# Patient Record
Sex: Female | Born: 1980 | Race: White | Hispanic: No | Smoking: Never smoker
Health system: Southern US, Community
[De-identification: ages and names within clinical notes are randomized; demographics above are authoritative.]

## PROBLEM LIST (undated history)

## (undated) DIAGNOSIS — J189 Pneumonia, unspecified organism: Secondary | ICD-10-CM

## (undated) DIAGNOSIS — M25372 Other instability, left ankle: Secondary | ICD-10-CM

## (undated) DIAGNOSIS — I10 Essential (primary) hypertension: Secondary | ICD-10-CM

## (undated) DIAGNOSIS — G43909 Migraine, unspecified, not intractable, without status migrainosus: Secondary | ICD-10-CM

## (undated) DIAGNOSIS — E669 Obesity, unspecified: Secondary | ICD-10-CM

## (undated) DIAGNOSIS — N3010 Interstitial cystitis (chronic) without hematuria: Secondary | ICD-10-CM

## (undated) DIAGNOSIS — M1990 Unspecified osteoarthritis, unspecified site: Secondary | ICD-10-CM

## (undated) DIAGNOSIS — F419 Anxiety disorder, unspecified: Secondary | ICD-10-CM

## (undated) DIAGNOSIS — K219 Gastro-esophageal reflux disease without esophagitis: Secondary | ICD-10-CM

## (undated) HISTORY — PX: LAPAROSCOPIC LYSIS OF ADHESIONS: SHX5905

## (undated) HISTORY — PX: GASTRIC BYPASS: SHX52

## (undated) HISTORY — PX: DILATION AND CURETTAGE OF UTERUS: SHX78

---
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
## 2000-03-24 ENCOUNTER — Other Ambulatory Visit: Admission: RE | Admit: 2000-03-24 | Discharge: 2000-03-24 | Payer: Self-pay | Admitting: *Deleted

## 2000-09-29 ENCOUNTER — Other Ambulatory Visit: Admission: RE | Admit: 2000-09-29 | Discharge: 2000-09-29 | Payer: Self-pay | Admitting: *Deleted

## 2001-02-03 ENCOUNTER — Encounter: Payer: Self-pay | Admitting: Family Medicine

## 2001-02-03 ENCOUNTER — Encounter: Admission: RE | Admit: 2001-02-03 | Discharge: 2001-02-03 | Payer: Self-pay | Admitting: Family Medicine

## 2001-05-26 ENCOUNTER — Other Ambulatory Visit: Admission: RE | Admit: 2001-05-26 | Discharge: 2001-05-26 | Payer: Self-pay | Admitting: Obstetrics and Gynecology

## 2001-07-05 ENCOUNTER — Encounter: Payer: Self-pay | Admitting: *Deleted

## 2001-07-05 ENCOUNTER — Ambulatory Visit (HOSPITAL_COMMUNITY): Admission: RE | Admit: 2001-07-05 | Discharge: 2001-07-05 | Payer: Self-pay | Admitting: Obstetrics and Gynecology

## 2001-11-15 ENCOUNTER — Inpatient Hospital Stay (HOSPITAL_COMMUNITY): Admission: AD | Admit: 2001-11-15 | Discharge: 2001-11-15 | Payer: Self-pay | Admitting: *Deleted

## 2001-12-11 ENCOUNTER — Inpatient Hospital Stay (HOSPITAL_COMMUNITY): Admission: AD | Admit: 2001-12-11 | Discharge: 2001-12-14 | Payer: Self-pay | Admitting: *Deleted

## 2002-01-23 ENCOUNTER — Other Ambulatory Visit: Admission: RE | Admit: 2002-01-23 | Discharge: 2002-01-23 | Payer: Self-pay | Admitting: *Deleted

## 2002-05-08 ENCOUNTER — Encounter: Payer: Self-pay | Admitting: Family Medicine

## 2002-05-08 ENCOUNTER — Encounter: Admission: RE | Admit: 2002-05-08 | Discharge: 2002-05-08 | Payer: Self-pay | Admitting: Family Medicine

## 2003-02-07 ENCOUNTER — Other Ambulatory Visit: Admission: RE | Admit: 2003-02-07 | Discharge: 2003-02-07 | Payer: Self-pay | Admitting: *Deleted

## 2003-11-20 ENCOUNTER — Inpatient Hospital Stay (HOSPITAL_COMMUNITY): Admission: AD | Admit: 2003-11-20 | Discharge: 2003-11-21 | Payer: Self-pay | Admitting: Obstetrics and Gynecology

## 2004-04-09 ENCOUNTER — Other Ambulatory Visit: Admission: RE | Admit: 2004-04-09 | Discharge: 2004-04-09 | Payer: Self-pay | Admitting: Obstetrics and Gynecology

## 2004-10-02 ENCOUNTER — Inpatient Hospital Stay (HOSPITAL_COMMUNITY): Admission: AD | Admit: 2004-10-02 | Discharge: 2004-10-02 | Payer: Self-pay | Admitting: Obstetrics and Gynecology

## 2004-10-24 ENCOUNTER — Inpatient Hospital Stay (HOSPITAL_COMMUNITY): Admission: RE | Admit: 2004-10-24 | Discharge: 2004-10-26 | Payer: Self-pay | Admitting: Obstetrics and Gynecology

## 2004-12-10 ENCOUNTER — Other Ambulatory Visit: Admission: RE | Admit: 2004-12-10 | Discharge: 2004-12-10 | Payer: Self-pay | Admitting: Obstetrics and Gynecology

## 2005-10-01 ENCOUNTER — Encounter (INDEPENDENT_AMBULATORY_CARE_PROVIDER_SITE_OTHER): Payer: Self-pay | Admitting: *Deleted

## 2005-10-01 ENCOUNTER — Ambulatory Visit (HOSPITAL_COMMUNITY): Admission: RE | Admit: 2005-10-01 | Discharge: 2005-10-01 | Payer: Self-pay | Admitting: Obstetrics and Gynecology

## 2005-10-01 HISTORY — PX: HYSTEROSCOPY W/ ENDOMETRIAL ABLATION: SUR665

## 2006-06-09 ENCOUNTER — Ambulatory Visit (HOSPITAL_COMMUNITY): Admission: RE | Admit: 2006-06-09 | Discharge: 2006-06-09 | Payer: Self-pay | Admitting: Obstetrics and Gynecology

## 2006-09-08 ENCOUNTER — Inpatient Hospital Stay (HOSPITAL_COMMUNITY): Admission: AD | Admit: 2006-09-08 | Discharge: 2006-09-08 | Payer: Self-pay | Admitting: Obstetrics and Gynecology

## 2006-10-01 ENCOUNTER — Inpatient Hospital Stay (HOSPITAL_COMMUNITY): Admission: AD | Admit: 2006-10-01 | Discharge: 2006-10-05 | Payer: Self-pay | Admitting: Obstetrics and Gynecology

## 2006-10-01 ENCOUNTER — Encounter (INDEPENDENT_AMBULATORY_CARE_PROVIDER_SITE_OTHER): Payer: Self-pay | Admitting: Obstetrics and Gynecology

## 2006-10-01 HISTORY — PX: TUBAL LIGATION: SHX77

## 2007-02-11 ENCOUNTER — Encounter (INDEPENDENT_AMBULATORY_CARE_PROVIDER_SITE_OTHER): Payer: Self-pay | Admitting: Obstetrics and Gynecology

## 2007-02-11 ENCOUNTER — Ambulatory Visit (HOSPITAL_COMMUNITY): Admission: RE | Admit: 2007-02-11 | Discharge: 2007-02-11 | Payer: Self-pay | Admitting: Obstetrics and Gynecology

## 2007-02-11 HISTORY — PX: HYSTEROSCOPY WITH NOVASURE: SHX5574

## 2007-09-04 ENCOUNTER — Inpatient Hospital Stay (HOSPITAL_COMMUNITY): Admission: AD | Admit: 2007-09-04 | Discharge: 2007-09-04 | Payer: Self-pay | Admitting: Obstetrics and Gynecology

## 2007-10-17 ENCOUNTER — Encounter: Admission: RE | Admit: 2007-10-17 | Discharge: 2007-10-17 | Payer: Self-pay | Admitting: Family Medicine

## 2007-11-22 ENCOUNTER — Ambulatory Visit (HOSPITAL_COMMUNITY): Admission: RE | Admit: 2007-11-22 | Discharge: 2007-11-23 | Payer: Self-pay | Admitting: Obstetrics and Gynecology

## 2007-11-22 ENCOUNTER — Encounter (INDEPENDENT_AMBULATORY_CARE_PROVIDER_SITE_OTHER): Payer: Self-pay | Admitting: Obstetrics and Gynecology

## 2007-11-22 HISTORY — PX: LAPAROSCOPIC ASSISTED VAGINAL HYSTERECTOMY: SHX5398

## 2008-01-01 ENCOUNTER — Inpatient Hospital Stay (HOSPITAL_COMMUNITY): Admission: AD | Admit: 2008-01-01 | Discharge: 2008-01-01 | Payer: Self-pay | Admitting: Obstetrics and Gynecology

## 2008-01-05 ENCOUNTER — Ambulatory Visit (HOSPITAL_COMMUNITY): Admission: RE | Admit: 2008-01-05 | Discharge: 2008-01-05 | Payer: Self-pay | Admitting: Obstetrics and Gynecology

## 2008-01-10 ENCOUNTER — Ambulatory Visit: Payer: Self-pay | Admitting: Emergency Medicine

## 2008-01-10 ENCOUNTER — Inpatient Hospital Stay (HOSPITAL_COMMUNITY): Admission: AD | Admit: 2008-01-10 | Discharge: 2008-01-15 | Payer: Self-pay | Admitting: Obstetrics and Gynecology

## 2008-01-12 ENCOUNTER — Encounter: Payer: Self-pay | Admitting: Obstetrics and Gynecology

## 2008-01-18 ENCOUNTER — Ambulatory Visit (HOSPITAL_COMMUNITY): Admission: RE | Admit: 2008-01-18 | Discharge: 2008-01-18 | Payer: Self-pay | Admitting: Obstetrics and Gynecology

## 2008-02-07 ENCOUNTER — Ambulatory Visit (HOSPITAL_COMMUNITY): Admission: RE | Admit: 2008-02-07 | Discharge: 2008-02-07 | Payer: Self-pay | Admitting: Obstetrics and Gynecology

## 2008-07-31 ENCOUNTER — Encounter (INDEPENDENT_AMBULATORY_CARE_PROVIDER_SITE_OTHER): Payer: Self-pay | Admitting: Obstetrics and Gynecology

## 2008-07-31 ENCOUNTER — Ambulatory Visit (HOSPITAL_COMMUNITY): Admission: RE | Admit: 2008-07-31 | Discharge: 2008-07-31 | Payer: Self-pay | Admitting: Obstetrics and Gynecology

## 2008-07-31 HISTORY — PX: LAPAROSCOPIC UNILATERAL SALPINGO OOPHERECTOMY: SHX5935

## 2008-07-31 HISTORY — PX: CYSTOSCOPY: SHX5120

## 2008-10-04 ENCOUNTER — Encounter (INDEPENDENT_AMBULATORY_CARE_PROVIDER_SITE_OTHER): Payer: Self-pay | Admitting: Urology

## 2008-10-04 ENCOUNTER — Ambulatory Visit (HOSPITAL_COMMUNITY): Admission: RE | Admit: 2008-10-04 | Discharge: 2008-10-04 | Payer: Self-pay | Admitting: Urology

## 2008-10-04 HISTORY — PX: CYSTOSCOPY W/ DILATION OF BLADDER: SUR374

## 2009-04-02 IMAGING — CT CT ABDOMEN W/ CM
1 of 6 series · 13 of 32 positions shown, 19 images · IV contrast ([ID] GASTRO-MX & 150ml omni/300%)
Comparison: January 18, 2008

CLINICAL DATA: Follow-up right adnexal abscess

CT ABDOMEN WITH CONTRAST,CT PELVIS WITH CONTRAST
TECHNIQUE: Multidetector CT imaging of the abdomen was performed
following the standard protocol during bolus administration of
intravenous contrast.,Technique:  Multidetector CT imaging of the
pelvis was performed following the standard protocol during
Contrast: 150 ml Omnipaque 300

[Series 4: recon 3: abd pelvis · axial · 0.70mm/px · z∈[-485,-86]mm · 13 of 461 slices shown, 19 images]
[im 31/461  soft-tissue]
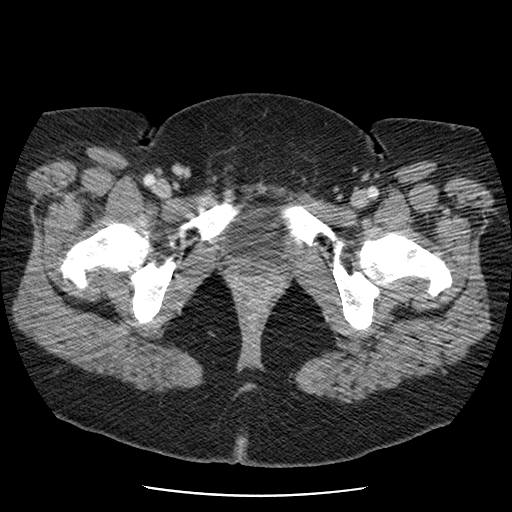
[im 31/461  bone]
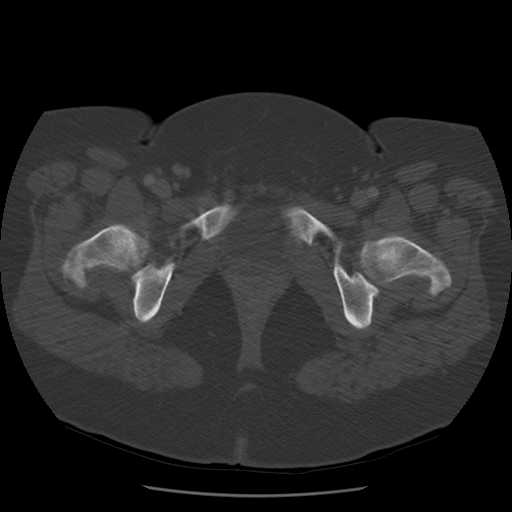
[im 62/461  soft-tissue]
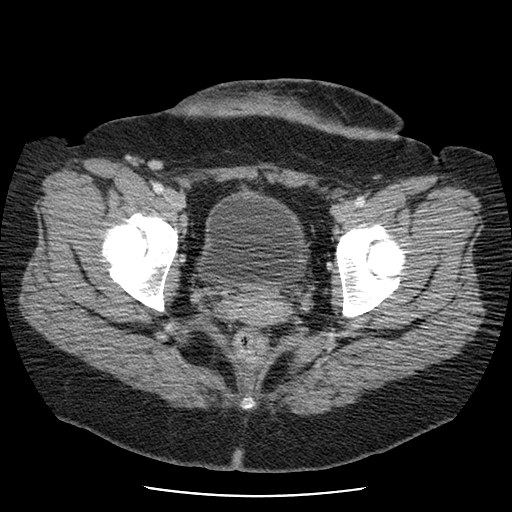
[im 93/461  soft-tissue]
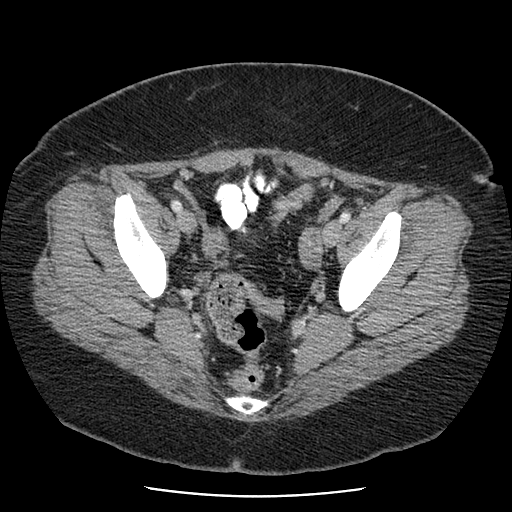
[im 123/461  soft-tissue]
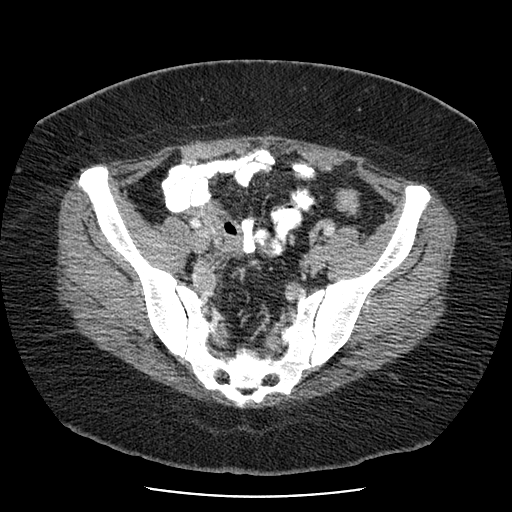
[im 154/461  soft-tissue]
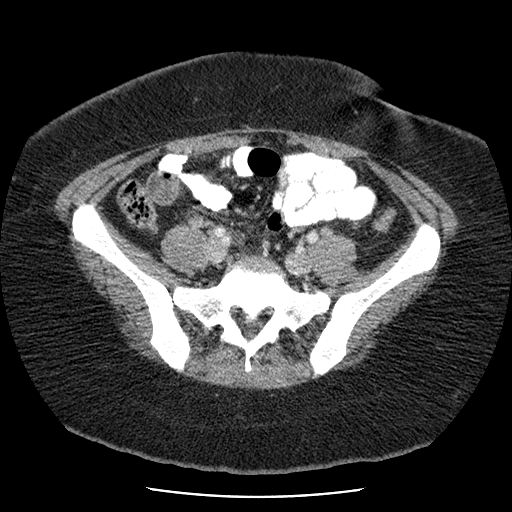
[im 185/461  soft-tissue]
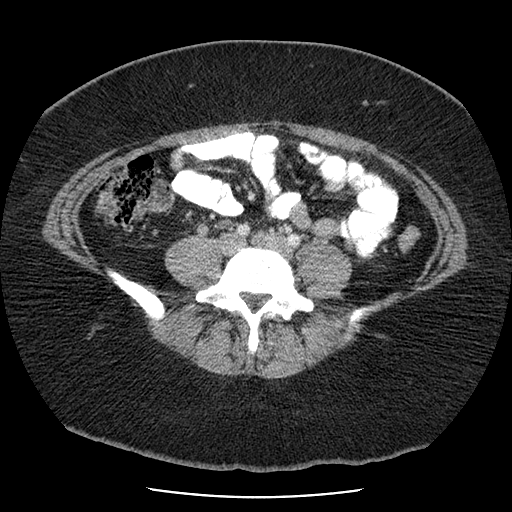
[im 246/461  soft-tissue]
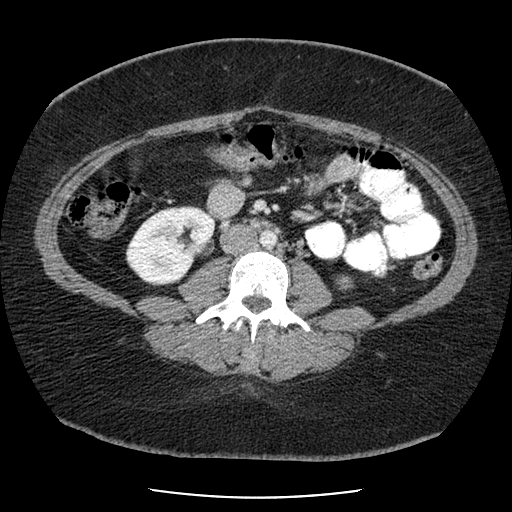
[im 277/461  soft-tissue]
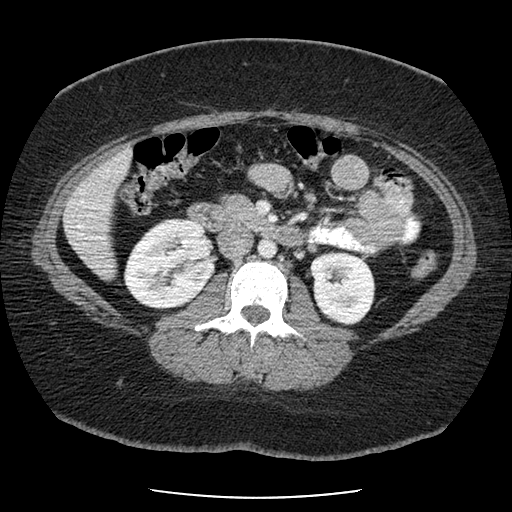
[im 307/461  soft-tissue]
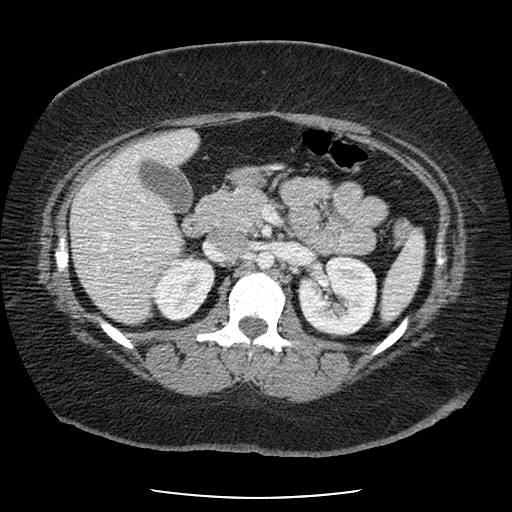
[im 307/461  bone]
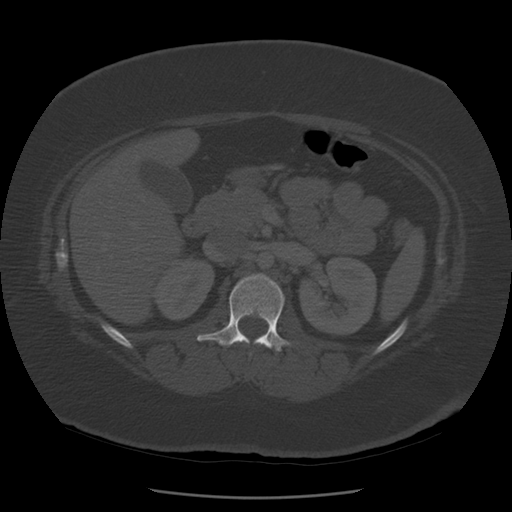
[im 338/461  soft-tissue]
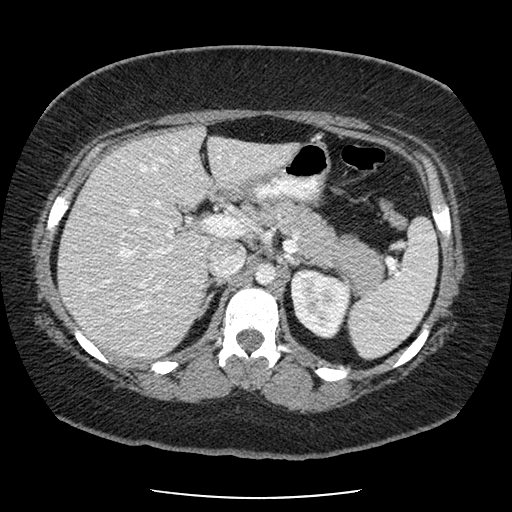
[im 338/461  lung]
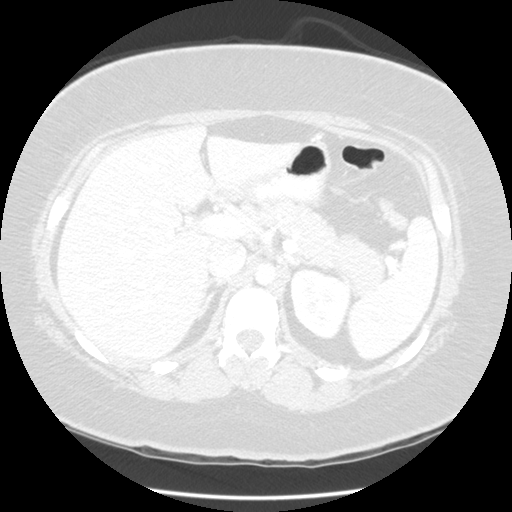
[im 369/461  soft-tissue]
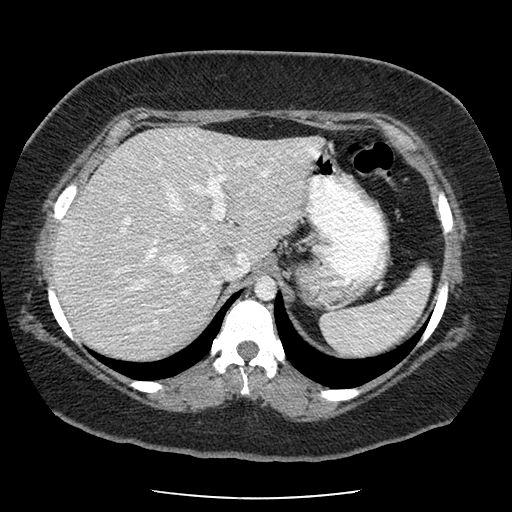
[im 369/461  lung]
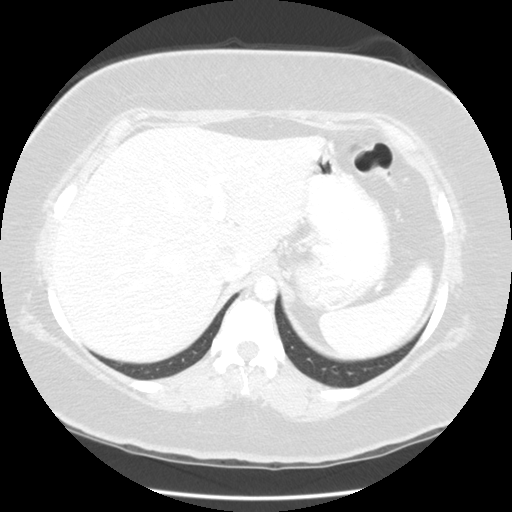
[im 399/461  soft-tissue]
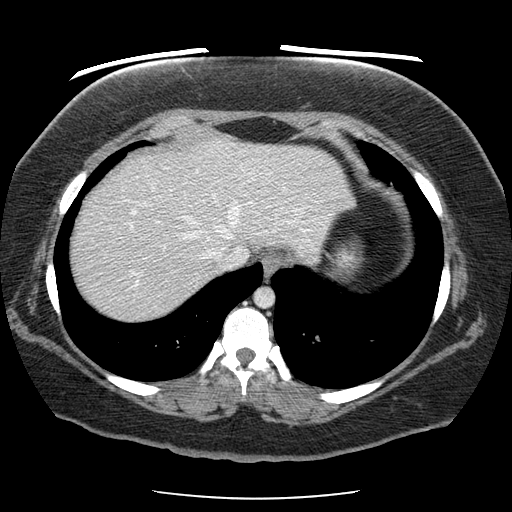
[im 399/461  lung]
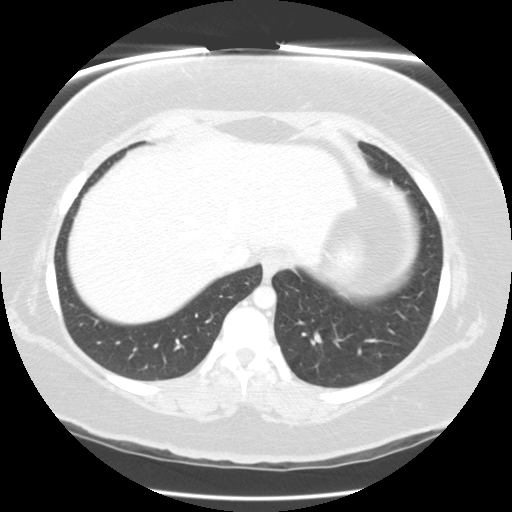
[im 430/461  soft-tissue]
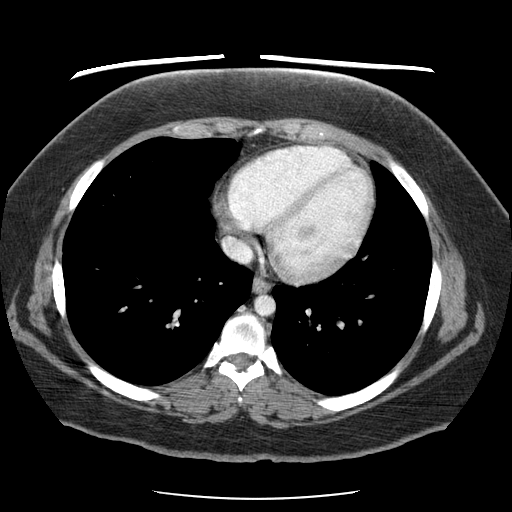
[im 430/461  lung]
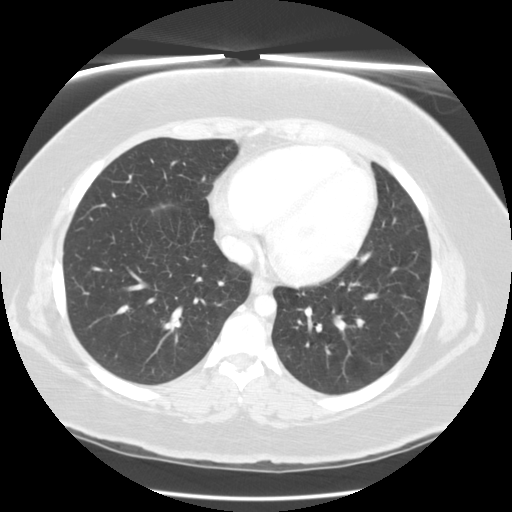

[13 of 32 positions shown; findings below may reference images not displayed]

FINDINGS: Abdomen: The lung bases are clear.  The liver, spleen,
pancreas, adrenal glands, and both kidneys have a normal
appearance.  The proximal right ureter remains mildly dilated, but
unchanged.  No free pelvic fluid or adenopathy are seen. The bowel
has a normal appearance with no evidence of gross inflammation or
obstruction.

Pelvis:  The right adnexal abscess is no longer present.  There are
no new pelvic fluid collections or free pelvic fluid.  No pelvic
adenopathy is present..
IMPRESSION: Interval resolution of right adnexal abscess.

## 2009-05-27 ENCOUNTER — Encounter: Admission: RE | Admit: 2009-05-27 | Discharge: 2009-05-27 | Payer: Self-pay | Admitting: Family Medicine

## 2009-07-26 ENCOUNTER — Encounter: Admission: RE | Admit: 2009-07-26 | Discharge: 2009-07-26 | Payer: Self-pay | Admitting: Family Medicine

## 2009-08-15 ENCOUNTER — Encounter: Admission: RE | Admit: 2009-08-15 | Discharge: 2009-08-30 | Payer: Self-pay | Admitting: Family Medicine

## 2010-07-16 LAB — COMPREHENSIVE METABOLIC PANEL
AST: 22 U/L (ref 0–37)
Alkaline Phosphatase: 97 U/L (ref 39–117)
Chloride: 103 mEq/L (ref 96–112)
GFR calc Af Amer: >60 mL/min (ref >60)
GFR calc non Af Amer: >60 mL/min (ref >60)
Glucose, Bld: 83 mg/dL (ref 70–99)
Potassium: 4.2 mEq/L (ref 3.5–5.1)
Total Protein: 7.1 g/dL (ref 6.0–8.3)

## 2010-07-16 LAB — CBC
HCT: 40.3 % (ref 36.0–46.0)
MCHC: 33.1 g/dL (ref 30.0–36.0)
Platelets: 271 10*3/uL (ref 150–400)
RBC: 4.92 MIL/uL (ref 3.87–5.11)
RDW: 16.5 % — ABNORMAL HIGH (ref 11.5–15.5)

## 2010-08-19 NOTE — Op Note (Signed)
NAME:  Barbara [name], Barbara [name]             ACCOUNT NO.:  [id number]   MEDICAL RECORD NO.:  [id number]          PATIENT TYPE:  AMB   LOCATION:  DAY                          FACILITY:  [facility]   PHYSICIAN:  Sigmund I. Patsi Sears, M.D.DATE OF BIRTH:  01-Sep-1980   DATE OF PROCEDURE:  10/04/2008  DATE OF DISCHARGE:                               OPERATIVE REPORT   PREOPERATIVE DIAGNOSIS:  Interstitial cystitis.   POSTOPERATIVE DIAGNOSIS:  Interstitial cystitis.   OPERATIONS:  Cystourethroscopy, hydrodistention of bladder from 700-750  mL, cold cup bladder biopsy, installation of Pyridium Marcaine in the  bladder, injection of Marcaine Kenalog in the subtrigonal space, B and O  suppository, IV Toradol.   SURGEON:  Sigmund I. Patsi Sears, M.D.   ANESTHESIA:  General LMA.   PREPARATION:  After appropriate preanesthesia, the patient is brought to  the operating room, placed on the operating room table in dorsal supine  position where general LMA anesthesia was induced.  She was then  replaced in dorsal lithotomy position where the pubis was prepped with  Betadine solution and draped in usual fashion.   HISTORY:  This 30 year old para 4 female, has a history of  endometriosis, polycystic ovarian syndrome, chronic suprapubic pain,  dyspareunia, and migraines.  Her puff score is 19.  She also has  nocturia x 4 to 6.  She is now for cystoscopic evaluation for possible  IC.   PROCEDURE:  Cystourethroscopy accomplished, and shows a normal-appearing  urethra.  The bladder trigone is normal position, ureteral orifices were  in normal position on the trigone.  The bladder holds 700 mL.  Even  without hydrodistention, the bladder shows microscopic hemorrhage and  ulceration.   The bladder holds 700 mL, and with hydrodistention, holds 750 mL.  It  appears to be stiff.  Cold cup bladder biopsies were taken, and biopsy  sites cauterized.  Following this, Pyridium Marcaine solution is  inserted in the  bladder, and Marcaine Kenalog solution is injected into  the subtrigonal space.  B and O suppository was given, Toradol was  given, and the patient was awakened and taken to recovery room in good  condition.      Sigmund I. Patsi Sears, M.D.  Electronically Signed     SIT/MEDQ  D:  10/04/2008  T:  10/04/2008  Job:  [id number]

## 2010-08-19 NOTE — Op Note (Signed)
[name], Barbara [name]             ACCOUNT NO.:  [id number]   MEDICAL RECORD NO.:  [id number]          PATIENT TYPE:  AMB   LOCATION:  SDC                           FACILITY:  [facility]   PHYSICIAN:  Guy Sandifer. Henderson Cloud, M.D. DATE OF BIRTH:  [birth year]   DATE OF PROCEDURE:  02/11/2007  DATE OF DISCHARGE:                               OPERATIVE REPORT   PREOPERATIVE DIAGNOSIS:  Menorrhagia.   POSTOPERATIVE DIAGNOSIS:  Menorrhagia.   PROCEDURE:  Novasure endometrial ablation, hysteroscopy, dilatation  curettage.  1% Xylocaine paracervical block.   SURGEON:  Harold Hedge, MD.   ANESTHESIA:  MAC.   SPECIMENS:  Endometrial curettings.   I&OS OF DISTENDING MEDIA:  55 mL deficit with most of that being on the  floor.   ESTIMATED BLOOD LOSS:  Minimal.   INDICATIONS AND CONSENTS:  This patient is a 30 year old, married, white  female status post tubal ligation with heavy menses.  Options have been  discussed. Hysteroscopy D&C and Novasure endometrial ablation have been  discussed preoperatively.  The potential risks and complications have  been discussed preoperatively including but limited to infection,  uterine perforation, organ damage, bleeding requiring transfusion of  blood products with HIV and hepatitis acquisition, DVT, PE, pneumonia.  Success failure rates of the ablation had been reviewed.  All questions  have been answered and consent is signed on the chart.   FINDINGS:  The uterine cavity is without abnormal structure.  Fallopian  tube and ostia are identified bilaterally.   DESCRIPTION OF PROCEDURE:  The patient was taken to the operating room  where she was identified, placed in dorsal supine position and given  intravenous sedation.  She does receive preoperative antibiotics.  She  is placed in the dorsal lithotomy position where she is gently prepped,  bladder straight catheterized and she is draped in a sterile fashion. A  bivalve speculum is placed and the anterior  cervical lip was injected  with 1% plain Xylocaine and grasped with a single-tooth tenaculum.  Paracervical block was placed at  the 2, 4, 5, 7, 8 and 10 o'clock  positions with approximately 20 mL total of 1% plain Xylocaine.  The  uterus sounds to 10 cm and the cervix sounds to 5.  The cervix is gently  and progressively dilated. The diagnostic hysteroscope is placed in the  endocervical canal and advanced under direct visualization using  distending media.  The hysteroscope is then withdrawn and sharp  curettage is carried out.  The Novasure device is then placed.  It  passes the cavity test on the first attempt.  Ablation is carried out  without difficulty.  The device is removed and seen to be intact.  The  hysteroscope is then replaced and  again advanced under direct visualization with distending media. No  evidence of perforation is noted.  The hysteroscope is withdrawn.  All  counts correct.  The patient is awakened, taken to the recovery room in  stable condition.      Guy Sandifer Henderson Cloud, M.D.  Electronically Signed     JET/MEDQ  D:  02/11/2007  T:  02/11/2007  Job:  [id number]

## 2010-08-19 NOTE — Op Note (Signed)
NAMEMORISSA, OBEIRNE             ACCOUNT NO.:  [id number]   MEDICAL RECORD NO.:  [id number]          PATIENT TYPE:  INP   LOCATION:  9304                          FACILITY:  [facility]   PHYSICIAN:  Guy Sandifer. Henderson Cloud, M.D. DATE OF BIRTH:  July 24, 1980   DATE OF PROCEDURE:  10/01/2006  DATE OF DISCHARGE:                               OPERATIVE REPORT   PREOPERATIVE DIAGNOSIS:  1. Intrauterine pregnancy at 36-6/7 weeks estimated additional age.  2. Labor.  3. Previous cesarean section, desires repeat.  4. Desires permanent sterilization.   POSTOPERATIVE DIAGNOSIS:  1. Intrauterine pregnancy at 36-6/7 weeks estimated additional age.  2. Labor.  3. Previous cesarean section, desires repeat.  4. Desires permanent sterilization.   PROCEDURE:  Repeat low transverse cesarean section and bilateral tubal  ligation.   SURGEON:  Guy Sandifer. Henderson Cloud, M.D.   ANESTHESIA:  Spinal.  Mal Amabile, M.D.   SPECIMENS:  Placenta and bilateral fallopian tube segments all to  pathology.   ESTIMATED BLOOD LOSS:  800 mL.   FINDINGS:  Viable female infant, Apgars of 8 and 9 at 1 and 5 minutes  respectively.  Birth weight 7 pounds 0 ounces.  Arterial cord pH 7.29.   INDICATIONS:  The patient is a 30 year old married white female G5, P4,  [facility] of October 23, 2006 whose prenatal care has been complicated by three  previous cesarean sections and desires repeat.  She also desires  bilateral tubal ligation.  She presents with regular uterine  contractions.  Denies rupture of membranes or vaginal bleeding or  central nervous system changes or epigastric pain.  Discussed with the  patient risks of repeat cesarean section.  Discussed permanence of tubal  ligation, alternatives, failure rate, increased ectopic risk.  All  questions answered.  Consent is signed on the chart.   PROCEDURE:  The patient taken to operating room where she is identified,  spinal anesthetic is placed and she is placed in dorsosupine  position  with 15 degrees left lateral wedge.  She is then prepped, Foley catheter  is placed in the bladder to drain. She is draped in sterile fashion.  After testing for adequate spinal anesthesia skin is entered through the  previous Pfannenstiel scar.  Dissection is carried out layers to  peritoneum.  Peritoneum is incised, extended superiorly and inferiorly.  Vesicouterine peritoneum was taken down cephalolaterally.  The bladder  flap was developed.  The bladder blade was placed.  Uterus is incised in  a low transverse manner and the uterine cavity is entered bluntly with a  hemostat.  The uterine incision is extended cephalolaterally with  fingers.  Artificial rupture of membranes for clear fluid is carried  out.  Vertex is delivered and the oronasal pharynx were suctioned.  Remainder the baby is delivered.  Good cry and tone is noted.  Cord is  clamped and cut.  The baby is handed to waiting pediatrics team.  Placenta is manually delivered and sent to pathology.  Uterine cavity is  clean.  Uterus is closed in two running locking imbricating layers of 0  Monocryl suture which achieves good hemostasis.  Left fallopian tube and  ovary were normal.  Left fallopian tube is grasped with Babcock clamp.  Knuckle of fallopian tube was doubly ligated with two free ties of 0  plain suture.  The intervening knuckle was then sharply resected with  the Metzenbaum scissors.  Cautery is used to assure complete hemostasis.  Similar procedure is carried out on the right fallopian tube.  Right  ovary also appears normal.  Anterior peritoneum is closed in running  fashion with 0 Monocryl suture which is also used to reapproximate the  pyramidalis muscle in midline.  The anterior rectus fascia is closed in  running fashion with 0 PDS suture.  The adipose layer is released with  cautery.  A 0 plain suture is used to reapproximate the subcutaneous  tissue.  A 4-0 Vicryl subcuticular suture is used to  close the incision  and Dermabond is applied.  All counts are correct.  The patient is taken  to recovery room in stable condition.      Guy Sandifer Henderson Cloud, M.D.  Electronically Signed     JET/MEDQ  D:  10/01/2006  T:  10/02/2006  Job:  [id number]

## 2010-08-19 NOTE — H&P (Signed)
Barbara [name], Barbara [name]             ACCOUNT NO.:  [id number]   MEDICAL RECORD NO.:  [id number]          PATIENT TYPE:  MAT   LOCATION:  MATC                          FACILITY:  [facility]   PHYSICIAN:  Michelle L. Grewal, M.D.DATE OF BIRTH:  [birth year]   DATE OF ADMISSION:  09/04/2007  DATE OF DISCHARGE:  09/04/2007                              HISTORY & PHYSICAL   HISTORY OF PRESENT ILLNESS:  A 30 year old female who had endometrial  ablation and has a known history of endometriosis, presents today  complaining of acute abdominal pain which started early this morning.  It is in the lower abdomen, more on the right side.  She denies any  fever.  She denies any nausea or vomiting.  She had a bowel movement  today that was normal.  She has actually had some liquids today.  On  exam, urine pregnancy test is negative.  Urine does show specific  gravity 1.030, otherwise negative.  White blood cell count 12.1,  hemoglobin 13, and platelet count 252.   PHYSICAL EXAMINATION:  VITAL SIGNS:  Temperature 98.2, respirations 20,  pulse 63, and BP 100/64.  GENERAL:  She is alert and oriented, no apparent distress.  ABDOMEN:  Soft, no rebound or guarding, some minimal tenderness in lower  quadrant and in the midline.  Pain decreased to 2/10 after 30 mg of  Toradol given.  PELVIS:  External genitalia within normal limits.  Vagina appears  normal.  Cervix, no lesions.  Uterus is anteverted and mildly tender  with movement.  Adnexa, right adnexal cyst palpated, very small and it  is very tender to palpation.  Left adnexa nontender and no masses.   IMPRESSION:  Abdominal pain, probably due to right ovarian cyst, doubt  torsion.   PLAN:  At this point, given that she responded so well to the Toradol, I  would like to send her home with Percocet 1-2 every 4-6 hours as needed  for pain and ibuprofen 600 mg every 6 hours as needed for pain.  She has  an appointment with Dr. Henderson Cloud tomorrow for an ultrasound  and office  visit.  She appears very comfortable today.  I did advice her if her  pain were to worsen or intensify, worse than it was on arrival, she was  to return, and otherwise she will follow up tomorrow.      Michelle L. Vincente Poli, M.D.  Electronically Signed     MLG/MEDQ  D:  09/04/2007  T:  09/04/2007  Job:  [id number]

## 2010-08-19 NOTE — Op Note (Signed)
Barbara [name], Barbara [name]             ACCOUNT NO.:  [id number]   MEDICAL RECORD NO.:  [id number]          PATIENT TYPE:  AMB   LOCATION:  SDC                           FACILITY:  [facility]   PHYSICIAN:  Guy Sandifer. Henderson Cloud, M.D. DATE OF BIRTH:  [birth year]   DATE OF PROCEDURE:  07/31/2008  DATE OF DISCHARGE:                               OPERATIVE REPORT   PREOPERATIVE DIAGNOSIS:  Right lower quadrant pain.   POSTOPERATIVE DIAGNOSIS:  Dense pelvic adhesions.   PROCEDURE:  Laparoscopy with right salpingo-oophorectomy, extensive  lysis of adhesions and cystoscopy.   SURGEON:  Guy Sandifer. Henderson Cloud, M.D.   ANESTHESIA:  General endotracheal intubation, Belva Agee, MD   SPECIMENS:  Right tube and ovary to Pathology.   ESTIMATED BLOOD LOSS:  Minimal.   INDICATIONS AND CONSENT:  This patient is a 30 year old married white  female G5, P4, status post LAVH with right lower quadrant pain.  Details  are dictated in the history and physical.  Laparoscopy, possible right  salpingo-oophorectomy, possible laparotomy is discussed preoperatively.  Potential risks and complications were discussed preoperatively  including, but limited to infection, organ damage, bleeding requiring  transfusion of blood products with HIV and hepatitis acquisition, DVT,  PE, pneumonia, dyspareunia, and recurrent pelvic abdominal pain.  All  questions were answered and consent is signed on the chart.   FINDINGS:  Upper abdomen is grossly normal.  Left tube and ovary are  normal.  The right ovary is densely adherent to the right pelvic  sidewall as well as a small dense attachment to a loop of small bowel.   DESCRIPTION OF PROCEDURE:  The patient was taken to operating room where  she is identified, placed in dorsal supine position.  General anesthesia  was induced via endotracheal intubation.  She is then placed in dorsal  lithotomy position where she is prepped abdominally and vaginally.  Bladder was straight catheterized.   Sponge on a stick is placed in  vagina and she is draped in sterile fashion.  Time-out is then  undertaken.  The infraumbilical and later suprapubic and right lower  quadrant areas were injected with 0.5% plain Marcaine.  A small  infraumbilical incision is made and a disposable Veress needle was  placed without difficulty.  2 L of gas were insufflated under low  pressure with good tympany in the right upper quadrant.  Veress needle  was removed.  A 10/11 XL blade with disposable trocar sleeve was then  placed under direct visualization without difficulty.  The operative  scope was then used.  A small suprapubic incision and later a right  lower quadrant incisions were made and 5-mm XL blade with disposable  trocar sleeves were placed through both under direct visualization  without difficulty.  The above findings were noted.  Then using very  careful blunt dissection with copious irrigation and some blunt  dissection the adhesions of the bowel were taken down and the ovary is  completely released.  This was over the course of the right ureter.  However, the peritoneum on the right pelvic sidewall remains intact and  there is no  damage to the ureter or bowel.  Then using the EnSeal  bipolar cautery cutting instrument, the right infundibulopelvic ligament  is taken down and extended across the meso completely releasing the  ovary, which was placed in the pelvis.  Then using the 5-mm laparoscope  to the suprapubic trocar sleeve, the EndoCatch is used to retrieve the  tube and ovary, it is removed through the umbilical incision with  minimal extension of the fascia on the umbilical incision.  This was  done under direct visualization through the 5-mm laparoscope.  The  trocar sleeve for the 10-mm laparoscope was then replaced and  reinspection reveals excellent hemostasis throughout the pelvis.  This  was after reduced pneumoperitoneum during this process.  Excess fluid  was removed.   Intercede is back loaded to the laparoscope and placed  over the areas of dissection.  An additional 10 mL of 0.5% plain  Marcaine instilled into the peritoneal cavity bringing the total use  to  approximately 17 mL for the case.  Instruments are removed.  Pneumoperitoneum is reduced.  The trocar sleeves were removed as well.  The fascia on the umbilical incision was closed with 0 Vicryl under good  visualization.  The skin of the umbilical incision was closed with  subcuticular 3-0 Vicryl suture.  All incisions were closed with  Dermabond.  It should be noted that the anterior abdominal wall was  elevated with towel clips, replacement of the Veress needle.  These  points of puncture for the towel clips were also covered with Dermabond.  The sponge stick is removed from the vagina.  All counts were  correct.  The patient is awakened and taken to recovery room in stable condition.      Guy Sandifer Henderson Cloud, M.D.  Electronically Signed     JET/MEDQ  D:  07/31/2008  T:  07/31/2008  Job:  [id number]

## 2010-08-19 NOTE — Discharge Summary (Signed)
Barbara [name], Barbara [name]             ACCOUNT NO.:  [id number]   MEDICAL RECORD NO.:  [id number]          PATIENT TYPE:  OIB   LOCATION:  9302                          FACILITY:  [facility]   PHYSICIAN:  Guy Sandifer. Henderson Cloud, M.D. DATE OF BIRTH:  1981-02-14   DATE OF ADMISSION:  11/22/2007  DATE OF DISCHARGE:  11/23/2007                               DISCHARGE SUMMARY   ADMITTING DIAGNOSES:  Dysmenorrhea and menorrhagia.   DISCHARGE DIAGNOSES:  Dysmenorrhea and menorrhagia.   PROCEDURE:  On November 22, 2007, laparoscopically-assisted vaginal  hysterectomy.   REASON FOR ADMISSION:  This patient is 30 year old white female G5, P3,  status post tubal ligation, status post endometrial ablation with  increasingly heavy and painful menses.  Details are dictated in the  history and physical.  She is admitted for surgical management.   HOSPITAL COURSE:  The patient is admitted to the hospital and undergoes  the above procedure.  On the evening of surgery, she has good pain  relief and is ambulating.  Vital signs are stable, afebrile, with clear  urine output.  On the day of discharge, she is voiding, tolerating a  regular diet, ambulating well with good pain relief.  Hemoglobin is 11.2  and pathology is pending.  Vital signs are stable.  She is afebrile.  Abdomen is soft with good bowel sounds.   CONDITION ON DISCHARGE:  Good.   DIET:  Regular as tolerated.   ACTIVITY:  No lifting, no operation of automobiles, no vaginal entry.  She is to call the office for problems including not limited to  temperature of 101 degrees, persistent nausea or vomiting, increasing  pain, or heavy vaginal bleeding.   MEDICATIONS:  1. Percocet 5/225 mg, #40, one to two p.o. q.6 h. p.r.n.  2. Ibuprofen 600 mg q.6 h. for 2 days, then p.r.n.  3. Multivitamin daily.   Follow up in the office in 2 weeks.      Guy Sandifer Henderson Cloud, M.D.  Electronically Signed     JET/MEDQ  D:  11/23/2007  T:  11/23/2007  Job:   [id number]

## 2010-08-19 NOTE — Op Note (Signed)
Barbara [name], Barbara [name]             ACCOUNT NO.:  [id number]   MEDICAL RECORD NO.:  [id number]          PATIENT TYPE:  OIB   LOCATION:  9302                          FACILITY:  [facility]   PHYSICIAN:  Guy Sandifer. Henderson Cloud, M.D. DATE OF BIRTH:  1980/04/17   DATE OF PROCEDURE:  11/22/2007  DATE OF DISCHARGE:                               OPERATIVE REPORT   PREOPERATIVE DIAGNOSES:  1. Dysmenorrhea.  2. Menorrhagia.   POSTOPERATIVE DIAGNOSES:  1. Dysmenorrhea.  2. Menorrhagia.   PROCEDURE:  Laparoscopically-assisted vaginal hysterectomy.   SURGEON:  Guy Sandifer. Henderson Cloud, MD.   Threasa HeadsDineen Kid. Rana Snare, MD.   ANESTHESIA:  General endotracheal intubation.   SPECIMENS:  Uterus to pathology.   ESTIMATED BLOOD LOSS:  200 mL.   INDICATIONS AND CONSENT:  This patient is a 30 year old married white  female G5, P3, status post tubal ligation, and status post NovaSure  endometrial ablation, continues to have increasingly heavy and painful  menses.  Details dictated in the history and physical.  Laparoscopically-  assisted vaginal hysterectomy and removal of the tube and ovary if  distinctly abnormal were discussed preoperatively.  Potential risks and  complications were discussed preoperatively including, but not limited  to infection, organ damage, bleeding requiring transfusion of blood  products with possible HIV and hepatitis acquisition, DVT, PE,  pneumonia, fistula formation, laparotomy, postoperative pain or  dyspareunia.  All questions have been answered and consent is signed on  the chart.   FINDINGS:  Upper abdomen is grossly normal.  Uterus is about 6 weeks in  size.  There is some scarring of the vesicouterine peritoneum to the  lower uterine segment.  The left uterosacral ligament is slightly  scarred and foreshortened.  There is a possible single implant of  endometriosis on the distal uterosacral ligament at the point of  insertion into the uterus.  This was taken by the  hysterectomy.  Tubes  are status post ligation bilaterally.  Ovaries are normal.   DESCRIPTION OF PROCEDURE:  The patient was taken to the operating room  where she was identified, placed in the dorsal supine position and  general anesthesia is induced, via endotracheal intubation.  She was  then placed in the dorsal lithotomy position where she was prepped  abdominally and vaginally and bladder straight catheterized.  A Hulka  tenaculum was placed in the uterus as a manipulator and she was draped  in a sterile fashion.  The infraumbilical and suprapubic areas were  injected in the midline with 0.5% plain Marcaine.  A small  infraumbilical incision was made.  A disposable Veress needle was placed  on the first attempt without difficulty.  Normal syringe and drop test  were noted.  Gas of 2 L were insufflated under low pressure with good  tympany in the right upper quadrant.  Veress needle was removed.  A  10/11 Xcel bladeless disposable trocar sleeve was placed using direct  visualization with the diagnostic laparoscope.  After placement, the  operative laparoscope was placed.  A small suprapubic incision was made  in the midline and a 5-mm Xcel bladeless disposable trocar  sleeve was  placed under direct visualization without difficulty.  The above  findings were noted.  Then using scissors, the adhesions of the  vesicouterine peritoneum were taken down cephalad laterally.  Then using  the Gyrus bipolar cautery cutting instrument, the proximal ligaments  were taken down to the level of vesicouterine peritoneum bilaterally.  Good hemostasis is maintained.  At this point, the suprapubic trocar  sleeve was removed.  Instruments were removed and attention was turned  to the vagina.  Posterior cul-de-sacs entered sharply.  Cervix was  circumscribed with unipolar cautery and the mucosa was advanced sharply  and bluntly.  Then using the Gyrus bipolar cautery instrument, the  uterosacral  ligaments were taken down followed by the bladder pillars,  cardinal ligaments, and uterine vessels bilaterally.  Fundus was  delivered posteriorly and the specimen was completely delivered.  The  uterosacral ligaments were then plicated the vaginal cuff with separate  sutures of 0 Monocryl.  All suture will be 0 Monocryl unless, otherwise,  designated.  Uterosacral ligaments were then plicated in the midline  with a third suture.  Cuff was closed with figure-of-eights.  A Foley  catheter was placed in the bladder and clear urine was noted.  Attention  was returned to the abdomen.  Pneumoperitoneum was reintroduced and the  suprapubic bladeless disposable trocar sleeve was placed again under  direct visualization without difficulty.  Copious irrigation was carried  out.  Minor bleeding at peritoneal edges was controlled with bipolar  cautery.  Inspection under reduced pneumoperitoneum reveals good  hemostasis all around.  Excess fluid was removed.  Pneumoperitoneum was  reduced and all the instruments and trocar sleeves were removed.  The  skin of the umbilical and suprapubic incisions were closed with  interrupted 2-0 Vicryl.  Dermabond was placed on both incisions.  All  counts were correct.  The patient was awakened and taken to recovery  room in stable condition.      Guy Sandifer Henderson Cloud, M.D.  Electronically Signed     JET/MEDQ  D:  11/22/2007  T:  11/22/2007  Job:  [id number]

## 2010-08-19 NOTE — Discharge Summary (Signed)
NAMEGRAZIELLA, Barbara [name]             ACCOUNT NO.:  [id number]   MEDICAL RECORD NO.:  [id number]          PATIENT TYPE:  INP   LOCATION:  9316                          [facility]ACILITY:  [facility]   PHYSICIAN:  Guy Sandifer. Henderson Cloud, M.D. DATE OF BIRTH:  [birth year]   DATE OF ADMISSION:  01/10/2008  DATE OF DISCHARGE:  01/15/2008                               DISCHARGE SUMMARY   ADMITTING DIAGNOSIS:  Right adnexal abscess with sepsis.   DISCHARGE DIAGNOSIS:  1. Right adnexal abscess with sepsis, improved.  2. Right ureteral obstruction, resolved.   REASON FOR ADMISSION:  This patient is a 30 year old white female, who  underwent laparoscopically-assisted vaginal hysterectomy.  She was  discharged home after an uncomplicated course.  She was seen in the  office at approximately 5.5 weeks postop for a totally normal exam,  feeling fine.  Several days later, she called with right lower quadrant  pain and low-grade temperatures.  She is evaluated in the Maternity  Admissions Unit at [facility] where a clean catch urine is  suspicious for a UTI.  Her white count is 12.5.  She is given Cipro to  treat a urinary tract infection and sent home.  After approximately 3 to  4 days of Cipro, she is evaluated in the office and her right lower  quadrant pain continues with a temperature at home of approximately 101.  In the office, her temperature is below 100, and her abdominal exam has  mild tenderness on the right side.  At that time, she is changed to oral  Augmentin, and an abdominopelvic CAT scan is obtained.  An approximately  2.4 cm fluid collection in the right adnexa is described that is medial  to the right ovary.  The ureters are normal.  It is not felt to be the  appendix based on CT findings and is well-above the vaginal cuff.  At  that point, oral Flagyl is added to the patient's antibiotic regimen in  addition to the Augmentin.  She is on this for approximately 4 days when  she presents to  [facility] and is found to have temperatures of  102 to 103 at home.  Upon admission, the patient is noted to have blood  pressures that are at times in the 70s to 80s systolic.  She is taken to  the intensive care unit where she is co-managed with the Intensivists.  She is given IV fluid boluses through the night.  Urine and blood  cultures are obtained.  It should be noted that the urine culture sent  from the patient's original presentation to the Maternity Admissions  Unit returned as mixed flora.  Upon admission to the hospital, her white  count is 11.7, hemoglobin is 10.9 with 95% neutrophils.  A chest x-ray  is obtained and is negative.  Abdominopelvic CAT scan is significant for  an ileus.  The mass in the pelvis now measures 3.1 x 4.6 cm with  surrounding inflammatory changes.  It again does not appear to be  involving the appendix.  In addition, there is now a right  hydronephrosis.  The patient  is on intravenous Unasyn.  The following  morning, blood pressures are 80/50s.  Pulse is in the 80s with normal  systolic rhythm.  Oxygen saturation is 96%.  The patient has moderate  right lower quadrant tenderness without rebound.  The remainder of her  abdomen remains soft with normal bowel sounds.  The patient is also seen  that day by Critical Care Medicine.  At that time, she was felt to be  improving, and the decision was made to hold on the placement of the  triple lumen catheter.  That afternoon, the patient improves clinically,  feeling a little better.  Blood pressure is 101/61 with a pulse in the  90s.  Urine output is improving as well.  General surgical consultation  is obtained on 01/11/2008.  Dr. Bertram Savin agrees with consultation for  Interventional Radiology to possibly drain the adnexal abscess and  hopefully place a drain.  On the evening of the 7th, she continues to  feel even better, complaining of a mild headache. with stable blood  pressures.  Her CBC  returned on the 7th with a white count of 30.1 and a  hemoglobin of 9.6.  The following day on the 8th, the patient is taken  to [facility].  Interventional radiologists are able to place  a needle into the right adnexal abscess but only obtain drops of bloody  serous fluid, which is sent for culture.  There are unable to aspirate  any further material, and they are unable to place a drain.  On the  afternoon of the 8th, the patient continues to improve clinically.  She  has decreasing pain, and no nausea or vomiting.  On admission, she was  having some diarrhea after having started the Augmentin.  On the 8th,  her white count decreases to 15.8 and hemoglobin is 9.3 and stable.  Blood culture returns as positive with identification pending.  Blood  pressure is stable in the 110s/70s with good oxygen saturation.  Further  telephone consultation with Dr. Zachery Dakins of General Surgery is  obtained.  On the evening of the 8th, her right lower quadrant decreases  further.  She no longer has any flank pain and is thirsty.  Blood  pressures are 105 to 120/60s to 70s with a pulse in the 60s in normal  sinus rhythm.  Oxygen saturation remains good.  She has mild right lower  quadrant tenderness without rebound, which is also improved.  Telephone  consultation with Dr. Vernie Ammons of Urology is also obtained.  In view of  the symmetric excretion on CAT scan on the right kidney, he feels the  kidney is probably not totally obstructed of 1 or 2 weeks.  In view of  the patient's improving clinical situation, he recommends following  closely for the next several days and considering a stent if the kidney  does not spontaneously drain.  The patient continues to receive oral  potassium supplementation, IV fluids, and IV antibiotics.  Dr. Freida Busman  again sees the patient on the 9th and agrees with continued observation.  She suggests if her condition should worsen that a laparoscopic  procedure could be  considered.  On the morning of the 9th, the patient  has no pain, is hungry.  Diarrhea is improving but continues.  Blood  pressures again remain stable.  White count has dropped to 9.6,  hemoglobin is 9.0, and potassium is 3.2.  The patient's diet is  advanced, and her IV is switched to  a saline lock.  She is continued on  IV Unasyn.  Stool was sent for C.diff toxin evaluation.  She was given  further potassium supplementation.  On the 10th, she is feeling good and  wants to go home.  Diarrhea is essentially resolved.  She has a mild  headache and remains afebrile with stable vital signs.  C.diff toxins  have returned as negative.  Final ID on the blood cultures is still  pending, and the potassium is 3.3.  Dr. Abbey Chatters of General Surgery  sees the patient and suggests switching to oral antibiotics in the next  day or so.  On the day of discharge, she feels normal.  Vital signs are  stable, she is afebrile, and potassium is 4.0.  Blood culture returns as  positive for Streptococcus sensitive to penicillin.  Renal ultrasound  reveals marked improvement in the right hydronephrosis and a positive  right ureteral jet on ultrasound.  There is an inference that perhaps a  kidney stone was passed, although none was identified radiologically or  by the patient.  It should also be noted the patient's urine culture  from admission to the hospital this time was negative.  Culture of the  abscess aspirate was negative as well.   CONDITION ON DISCHARGE:  Good.   DIET:  Regular as tolerated.   ACTIVITY:  Rest, no heavy lifting.   MEDICATIONS:  1. Augmentin 875 mg b.i.d. for a week.  2. Flagyl 500 mg b.i.d. for a week.   She will call for problems including but not limited to a temperature of  101 degrees, increasing right lower quadrant pain, nausea, vomiting,  shaking chills.  An abdominopelvic CAT scan is scheduled for 3 days from  now on the 14th, and she will follow up in the office in 4  to 5 days.      Guy Sandifer Henderson Cloud, M.D.  Electronically Signed     JET/MEDQ  D:  01/15/2008  T:  01/15/2008  Job:  [id number]   cc:   Lennie Muckle, MD  [address]   [address]  [place] [address]

## 2010-08-19 NOTE — Consult Note (Signed)
Barbara [name], Barbara [name]             ACCOUNT NO.:  [id number]   MEDICAL RECORD NO.:  [id number]          PATIENT TYPE:  INP   LOCATION:  9374                          [facility]ACILITY:  [facility]   PHYSICIAN:  Lennie Muckle, MD      DATE OF BIRTH:  12-04-1980   DATE OF CONSULTATION:  DATE OF DISCHARGE:                                 CONSULTATION   REASON FOR CONSULTATION:  Right lower quadrant pain and she has abscess,  question of fistula.   REQUESTING PHYSICIAN:  Guy Sandifer. Henderson Cloud, MD   HISTORY OF PRESENT ILLNESS:  [name] is a pleasant 30 year old  female who apparently had a hysterectomy approximately 7 weeks ago.  She  had had normal postoperative course until approximately 12 days ago when  she began having right lower quadrant pain.  She states the pain was a  constant pain.  It did not radiate.  She had no aggravated or relieving  factors.  Few days ago, she began having fevers low grade at home and  then began to have severe worsening right lower quadrant pain.  She has  also had a fever of 102.  She had had a CT scan on January 05, 2008,  which showed a fluid collection of approximately 2.6 cm.  Due to her  worsening pain and she had also came in to the emergency department last  night with a repeat CT scan, which showed increase in her fluid  collection to 4.6 cm.  She also had an episode of hypotension with her  blood pressure did drop to 65/40s.  She received IV fluids and responded  with the now blood pressure 96/54.  She denies having any nausea or  emesis.  She has been on antibiotics at home of Augmentin and Flagyl.  She began having loose stools in the past couple of days.  She describes  having approximately 4 bowel movements a day.   PAST MEDICAL HISTORY:  She had endometriosis and has had pain with this.  She also has psoriasis.   PAST SURGICAL HISTORY:  She has had four C-sections and she had recent  abdominal and vaginal hysterectomy.   SOCIAL HISTORY:  She is  divorced.  Has four children.  She does smoke.  No significant alcohol use.   FAMILY HISTORY:  Significant for heart disease, irritable bowel  syndrome, and hepatitis.   REVIEW OF SYSTEMS:  From the patient and the patient's chart, she has no  cardiopulmonary or previous renal or GI history.   PHYSICAL EXAMINATION:  GENERAL:  She is a pleasant young female, who is  in no acute distress, who is pleasant.  She answers questions  appropriately.  VITAL SIGNS:  T-max is 101.4, T-current is 98.6, blood pressure  currently is 96/54, and heart rate is 83.  HEENT:  Extraocular muscles are intact.  Head is normocephalic.  Oral  mucosa is moist.  CHEST:  Clear to auscultation bilaterally.  CARDIOVASCULAR:  Regular rate and rhythm.  ABDOMEN:  Obese and soft.  She is tender in the right lower quadrant.  No peritoneal signs.  MUSCULOSKELETAL:  No deformities or edema.  SKIN:  She has no rashes.  She has a normal pink tone.  PSYCHIATRIC:  Normal.   LABORATORY DATA:  Her white count is elevated at 30.  Hemoglobin and  hematocrit 9.6 and 29.  BUN and creatinine 6 and 0.7.   ASSESSMENT AND PLAN:  Right lower quadrant fluid collection, question  etiology.  She is about 7 weeks now from her surgical procedure.  I  would recommend having this percutaneously drained.  Radiology has been  consulted and they will drain her tomorrow.  I would agree with the IV  antibiotics that she is currently on.  She does have some diarrhea,  which was likely from her Augmentin.  I think C. diff is less likely.  Hopefully, the Radiology will be able to leave a drain in place and will  be able to follow with this and she will be able to be discharged home  with drain care.  I would like to leave this in place for full removal  of the fluid collection and likely perform a drain study to ensure there  is no fistula connection.  We talked with Dr. Henderson Cloud and we will re-  image her following her aspiration to ensure that  her ureteral  obstruction is relieved.  If it is not, then we may have to have a  Urology consult with this.  She has a normal BUN and creatinine and is  making good urine currently.  I will follow the patient to ensure that  she does not need a surgical intervention, which currently she does not  need an acute surgery.      Lennie Muckle, MD  Electronically Signed     ALA/MEDQ  D:  01/11/2008  T:  01/12/2008  Job:  [id number]

## 2010-08-19 NOTE — H&P (Signed)
NAMELAKINDRA, Barbara [name]             ACCOUNT NO.:  [id number]   MEDICAL RECORD NO.:  [id number]           PATIENT TYPE:   LOCATION:                                 FACILITY:   PHYSICIAN:  Guy Sandifer. Henderson Cloud, M.D. DATE OF BIRTH:  [birth year]   DATE OF ADMISSION:  DATE OF DISCHARGE:                              HISTORY & PHYSICAL   CHIEF COMPLAINT:  Pelvic pain and heavy menses.   HISTORY OF PRESENT ILLNESS:  This patient is a 30 year old married white  female G5, P3 status post tubal ligation was is also status post  NovaSure endometrial ablation in November 2008.  She has had  increasingly heavy menses since then.  It can be quite irregular  starting and stopping for 3 or 4 days sometimes for week at a time.  She  will change tampons every 1-2 hours.  Pelvic pain has been getting  progressively worse and has led to evaluation in the emergency room.  Ultrasound in my office on September 05, 2007, was consistent with a uterus  measuring 7.7 x 4.3 x 5.4 cm.  There is a 3.4 cm hemorrhagic cyst of the  right ovary.  After discussion of options, she is being admitted for  laparoscopically-assisted vaginal hysterectomy.  A left or right ovary  will possibly be removed if distinctly abnormal.  Potential risks and  complications have been discussed preoperatively.   PAST MEDICAL HISTORY:  1. Abnormal Pap smear.  2. History of headache.  3. Urinary tract infections.  4. Psoriasis.   FAMILY HISTORY:  1. Positive for heart disease.  2. Hepatitis.  3. Ulcer disease.  4. Irritable bowel syndrome.  5. Anemia.  6. Thyroid disease.  7. Osteoporosis.  8. Diverticulosis.  9. Arthritis.  10.Diabetes.  11.Hypertension.  12.Lung cancer.   MEDICATIONS:  Oxycodone p.r.n. and ibuprofen p.r.n.   ALLERGIES:  No known drug allergies.   SOCIAL HISTORY:  The patient smokes greater than one-half pack  cigarettes a day.  Denies drug or alcohol abuse.   REVIEW OF SYSTEMS:  NEURO:  Denies headache.  CARDIO:  Denies chest pain.  PULMONARY:  Denies shortness of breath.  GI:  Denies recent changes in bowel habits.   PHYSICAL EXAMINATION:  VITAL SIGNS:  Height 5 feet 7 inches, weight 226  pounds, blood pressure 114/80.  HEENT:  Without thyromegaly.  LUNGS:  Clear to auscultation.  HEART:  Regular rate and rhythm.  BACK:  Without CVA tenderness.  ABDOMEN:  Obese, soft, and nontender without palpable masses.  PELVIC EXAM:  Vulvovaginal and cervix without lesion.  Uterus, upper  limits, normal size, mobile, and nontender.  Adnexa nontender without  masses.  EXTREMITIES:  Grossly within normal limits.  NEUROLOGICAL EXAM:  Grossly within normal limits.   ASSESSMENT:  Dysmenorrhea and menorrhagia.   PLAN:  Laparoscopically-assisted vaginal hysterectomy, possible right or  left salpingo-oophorectomy if distinctly abnormal with Dr. Henderson Cloud.      Guy Sandifer Henderson Cloud, M.D.  Electronically Signed     JET/MEDQ  D:  11/16/2007  T:  11/17/2007  Job:  [id number]

## 2010-08-19 NOTE — H&P (Signed)
Barbara [name], Barbara [name]             ACCOUNT NO.:  [id number]   MEDICAL RECORD NO.:  [id number]          PATIENT TYPE:  AMB   LOCATION:  SDC                           [facility]ILITY:  [facility]   PHYSICIAN:  Guy Sandifer. Henderson Cloud, M.D. DATE OF BIRTH:  07-01-1980   DATE OF ADMISSION:  02/11/2007  DATE OF DISCHARGE:                              HISTORY & PHYSICAL   CHIEF COMPLAINT:  Heavy menses.   HISTORY OF PRESENT ILLNESS:  This patient is a 30 year old married white  female, G5, P3, husband status post vasectomy, who continues to have  extremely heavy menses.  After consideration of options, she is being  admitted for hysteroscopy and NovaSure endometrial ablation.   PAST MEDICAL HISTORY:  1. Migraine headaches  2. History of concussion at age 23.   OBSTETRICAL HISTORY:  Cesarean section x3.  Cryotherapy of the cervix  x1.  Laparoscopy, hysteroscopy, D&C, 2007.   FAMILY HISTORY:  Coronary artery disease, paternal grandfather.  Chronic  hypertension, maternal great-grandfather.   MEDICATIONS:  None.   No known drug allergies.   SOCIAL HISTORY:  Denies tobacco, alcohol, or drug abuse.   REVIEW OF SYSTEMS:  NEUROLOGIC:  History of migraines as above.  CARDIAC:  No chest pain.  PULMONARY:  No shortness of breath.  GI:  Denies recent changes in bowel habits.   PHYSICAL EXAMINATION:  VITAL SIGNS:  Height 5 feet 7 inches.  HEENT: Without thyromegaly.  LUNGS: Clear to auscultation.  HEART:  Regular rate and rhythm.  BACK:  Without CVA tenderness.  BREASTS:  Without mass, retraction, or discharge.  ABDOMEN:  Soft, nontender, without masses.  PELVIC:  Vulva, vagina, and cervix without lesion.  Uterus upper normal  size, mobile, nontender.  Adnexa nontender, without masses.  EXTREMITIES:  Grossly within normal limits.  NEUROLOGICAL:  Grossly within normal limits.   ASSESSMENT:  Menorrhagia.   PLAN:  Hysteroscopy, NovaSure endometrial ablation.      Guy Sandifer Henderson Cloud, M.D.  Electronically  Signed     JET/MEDQ  D:  02/10/2007  T:  02/11/2007  Job:  [id number]

## 2010-08-19 NOTE — H&P (Signed)
Barbara [name], Barbara [name]             ACCOUNT NO.:  [id number]   MEDICAL RECORD NO.:  [id number]          PATIENT TYPE:  AMB   LOCATION:                                [facility]   PHYSICIAN:  Guy Sandifer. Henderson Cloud, M.D. DATE OF BIRTH:  1980/09/17   DATE OF ADMISSION:  07/31/2008  DATE OF DISCHARGE:                              HISTORY & PHYSICAL   For admission at [facility] at [place], [place] at July 31, 2008.   CHIEF COMPLAINT:  Pelvic pain.   HISTORY OF PRESENT ILLNESS:  This patient is a 30 year old married white  female G5, P4, status post LAVH on November 22, 2007.  Following that, she  did well for about 5-1/2 weeks.  She had onset of pelvic pain and was  eventually admitted to [facility] with a small right abscess.  She  had a positive blood culture for streptococcus at that time.  There was  approximately a 4-cm fluid collection which was aspirated for a small  amount of bloody fluid by Radiology.  The patient did well with  conservative treatment and was subsequently discharged home.  She has  occasional right lower quadrant pain which waxes and wanes.  Also, has  pain with intercourse.  She has recently been diagnosed with  interstitial cystitis and has been on Elmiron treatment for  approximately 2 weeks.  There is no fever.  No nausea or vomiting.  No  abnormal vaginal discharge or bleeding.  Ultrasound in my office on  May 25, 2008, reveals polycystic ovaries.  There was no fluid in  the cul-de-sac and no fluid collections were noted.  After discussion of  options, she is being admitted for laparoscopy, possible right salpingo-  oophorectomy, possible laparotomy.  Potential risks and complications  have been discussed preoperatively.   PAST MEDICAL HISTORY:  1. Abnormal Pap smear.  2. History of headache.  3. Urinary tract infection.  4. Psoriasis.   PAST SURGICAL HISTORY:  1. LAVH as above.  2. Endometrial ablation.  3.  Hysteroscopy.   OBSTETRIC HISTORY:  Cesarean section x4.   MEDICATIONS:  Elmiron 100 mg 3 times a day.   No known drug allergies.   SOCIAL HISTORY:  Denies tobacco, alcohol, or drug abuse.   FAMILY HISTORY:  Positive for heart disease, hepatitis, ulcer disease,  irritable bowel syndrome, anemia, thyroid disease, osteoporosis,  diverticulosis, arthritis, diabetes, hypertension, and lung cancer.   SOCIAL HISTORY:  Patient has stopped smoking cigarettes.  Denies alcohol  or drug abuse.   REVIEW OF SYSTEMS:  NEURO:  Occasional headache.  CARDIAC:  Denies chest  pain.  PULMONARY:  Denies shortness of breath.  GI: Abdominal pain as  above.   PHYSICAL EXAMINATION:  VITAL SIGNS:  Height 5 feet 3-1/2 inches, weight  237.4 pounds, blood pressure 110/74.  LUNGS:  Clear to auscultation.  HEART:  Regular rate and rhythm.  BACK:  No CVA tenderness.  ABDOMEN:  Obese, soft with mild right lower quadrant tenderness without  rebound or masses.  Pelvic exam; vulva and vagina without lesion.  She  is mildly tender  over the bladder as well as tender toward the right  adnexa.  Left adnexa nontender without palpable masses.  EXTREMITIES:  Grossly within normal limits.  NEUROLOGIC:  Grossly within normal limits.   ASSESSMENT:  Right lower quadrant pain.   PLAN:  Laparoscopy, possible right salpingo-oophorectomy, possible  laparotomy.      Guy Sandifer Henderson Cloud, M.D.  Electronically Signed     JET/MEDQ  D:  07/23/2008  T:  07/24/2008  Job:  [id number]

## 2010-08-19 NOTE — Discharge Summary (Signed)
Barbara [name], Barbara [name]             ACCOUNT NO.:  [id number]   MEDICAL RECORD NO.:  [id number]          PATIENT TYPE:  INP   LOCATION:  9304                          [facility]ILITY[facility]  [facility]   PHYSICIAN:  Duke Salvia. Marcelle Overlie, M.D.DATE OF BIRTH:  December 09, 1980   DATE OF ADMISSION:  10/01/2006  DATE OF DISCHARGE:  10/05/2006                               DISCHARGE SUMMARY   ADMISSION DIAGNOSES:  1. Intrauterine pregnancy at 36-6/7 weeks estimated gestational age.  2. Previous Cesarean section.  3. Spontaneous onset of labor.  4. Multiparity, desires permanent sterilization.   DISCHARGE DIAGNOSES:  1. Status post low transverse Cesarean section.  2. Viable female infant.  3. Permanent sterilization.   PROCEDURES:  1. Repeat low transverse Cesarean section.  2. Bilateral tubal ligation.   REASON FOR ADMISSION:  Please see written H and P.   HOSPITAL COURSE:  The patient is a 30 year old white married female  gravida 5, para 4 who presented to [facility] with  spontaneous onset of labor.  The patient has had three previous Cesarean  sections and desired repeat.  Due to multiparity, the patient also  requested bilateral tubal ligation.  On admission the patient was  transferred to the operating room where spinal anesthesia was  administered without difficulty.  A low transverse incision was made  with delivery of a viable female infant weighing 7 pounds, 0 ounces with  Apgar's of 8 at 1 minute and 9 at 5 minutes.  Arterial cord pH was 7.29.  Bilateral tubal ligation was performed without difficulty.  The patient  tolerated the procedure well and was taken to the recovery room in  stable condition.  On postoperative day #1 the patient was without  complaints.  Vital signs were stable.  She was afebrile.  Abdomen was  soft. Abdominal dressing was noted to be clean, dry and intact.  Laboratory findings revealed hemoglobin of 9.3, platelet count 231,000,  white blood cell count  19.2.  On postoperative day #2 the patient was  without complaints.  Vital signs were stable.  She was afebrile.  She is  ambulating well.  On postoperative day #3 the patient was without  complaints.  Vital signs were stable.  She was afebrile.  Fundus was  firm and nontender.  Incision was clean, dry and intact.  Subcuticular  closure was noted.  Due to the infant being in the NICU and baby being  discharged on the following morning, the patient was hospitalized for an  additional day.  On postoperative day #4 the patient was without  complaint.  The baby is being discharged from the hospital doing well  and breastfeeding without difficulty.  Vital signs are stable.  She is  afebrile.  Fundus firm and nontender.  Incision was clean, dry and  intact.  Subcuticular closure was noted with a small amount of erythema  noted on the right margin of the incision.  No drainage or fluctuance  was noted.  Discharge instructions were reviewed and the patient was  later discharged to home.   CONDITION ON DISCHARGE:  Stable.   DIET:  Regular as  tolerated.   ACTIVITY:  No heavy lifting, no driving x2 weeks, no vaginal entry.   FOLLOWUP:  Patient is to follow up in the office in one week for an  incision check.  She is to call for temperature greater than 100  degrees, persistent nausea, vomiting, heavy vaginal bleeding and/or  redness or drainage from the incisional site.   DISCHARGE MEDICATIONS:  1. Percocet 5/325 #30, one p.o. q.4-6h. p.r.n.  2. Motrin 600 mg q.6h.  3. Prenatal vitamins one p.o. daily.  4. Colace one p.o. daily p.r.n.      Julio Sicks, N.P.      Richard M. Marcelle Overlie, M.D.  Electronically Signed    CC/MEDQ  D:  10/05/2006  T:  10/05/2006  Job:  [id number]

## 2010-08-22 NOTE — Discharge Summary (Signed)
NAMEMARGOT, Barbara [name]                       ACCOUNT NO.:  [id number]   MEDICAL RECORD NO.:  [id number]                   PATIENT TYPE:  INP   LOCATION:  9142                                 [facility]   PHYSICIAN:  Duke Salvia. Marcelle Overlie, M.D.            DATE OF BIRTH:  23-Oct-1980   DATE OF ADMISSION:  12/12/2001  DATE OF DISCHARGE:  12/14/2001                                 DISCHARGE SUMMARY   ADMITTING DIAGNOSES:  1. Intrauterine pregnancy at term.  2. Repeat cesarean delivery.  3. Induction of labor, failed.  4. History of microsomia.   DISCHARGE DIAGNOSES:  1. Status post repeat low transverse cesarean section.  2. Viable female infant.   PROCEDURE:  Repeat low transverse cesarean section.   REASON FOR ADMISSION:  Please see written H&P.   HOSPITAL COURSE:  The patient was admitted to [facility] at  38 6/7 weeks estimated gestational age for an induction of labor due to  macrosomia. The patient has had a previous cesarean section and desired an  attempt at a vaginal birth after a cesarean.  Indications for previous  cesarean section was due to macrosomia with infant weighing 11 pounds 2  ounces. Estimated fetal weight with this pregnancy was 9 pounds 8 ounces.  The cervix was noted to be 1 cm dilated with vertex presentation. The  patient did have spontaneous rupture of membranes with clear fluid. IV  antibiotics were administered prophylactically for a history of positive  group B beta strep. After a trial of labor for approximately six hours, the  cervix was unchanged. A decision was made to proceed with a repeat low  cesarean delivery. The patient was transferred to the operating room where  spinal anesthesia was administered without difficulty. A low transverse  incision was made for the delivery of a viable female infant weighing 8  pounds 11 ounces with Apgar of 8 at 1 minute and 9 at 5 minutes. The patient  tolerated the procedure well and was  taken to the recovery room in stable  condition.   On postoperative day one, the patient had good return of bowel function,  abdomen was soft, abdominal dressing was partially removed which revealed an  incision that was clean, dry and intact. Laboratory data  revealed a hemoglobin of 10.8, platelet count of 207,000, WBC count of 10.7.  On postoperative day 2, the patient was doing well ambulating without  assistance. She was tolerating a regular diet without complaints of nausea  and vomiting. Abdomen was soft, incision was clean, dry and intact and  staples were intact. The patient desired early discharge. Instructions were  reviewed and patient was discharged home.   CONDITION ON DISCHARGE:  Good.   DIET:  Regular as tolerated.   ACTIVITY:  No heavy lifting, no vaginal entry, no driving x2 weeks.   FOLLOWUP:  The patient is to follow-up in the office in two days for  staple  removal. She is to call for a temperature greater than 100 degrees,  persistent nausea and vomiting, heavy vaginal bleeding or redness or  drainage from the incision site.   DISCHARGE MEDICATIONS:  1. Percocet 5/325, #30, 1 p.o. every 4-6 p.r.n. pain.  2. Motrin 600 mg over the counter every 6 hours.  3. Prenatal vitamins one p.o. daily.     Julio Sicks, NP                          Duke Salvia. Marcelle Overlie, M.D.    CC/MEDQ  D:  01/14/2002  T:  01/16/2002  Job:  [id number]

## 2010-08-22 NOTE — Op Note (Signed)
Barbara [name], Barbara [name]             ACCOUNT NO.:  [id number]   MEDICAL RECORD NO.:  [id number]          PATIENT TYPE:  OIB   LOCATION:  9399                          [facility]  [facility]   PHYSICIAN:  Guy Sandifer. Henderson Cloud, M.D. DATE OF BIRTH:  November 24, 1980   DATE OF PROCEDURE:  10/01/2005  DATE OF DISCHARGE:                                 OPERATIVE REPORT   PREOPERATIVE DIAGNOSES:  1.  Menorrhagia.  2.  Dysmenorrhea.   POSTOPERATIVE DIAGNOSES:  1.  Menorrhagia.  2.  Endometriosis.   PROCEDURE:  Laparoscopy with lysis of adhesions, ablation of endometriosis,  hysteroscopy, dilatation curettage.   SURGEON:  Harold Hedge, M.D.   ANESTHESIA:  General endotracheal intubation.   SPECIMENS:  Endometrial curettings.   ESTIMATED BLOOD LOSS:  Minimal.   I'S AND O'S:  Sorbitol distending medium 150 mL with a lot of that being on  the floor.   INDICATIONS AND CONSENT:  This patient is a 30 year old married white female  G-4, P-3 with increasingly heavy and painful menses.  Details are dictated  in the history and physical.  Laparoscopy, hysteroscopy, D&C has been  discussed with the patient preoperatively.  Potential risks and  complications have been discussed preoperatively including but limited to;  infection, uterine perforation, organ damage, bleeding requiring transfusion  of blood products with possible transfusion reaction, HIV and hepatitis  acquisition, DVT, PE, pneumonia, laparotomy, hysterectomy, recurrent pain or  abnormal bleeding.  All questions have been answered and consent signed on  the chart.   FINDINGS:  There is an omental adhesion to the right of the umbilicus the  anterior abdominal wall.  Anterior cul-de-sac contains several filmy  adhesions.  Posterior cul-de-sac is without lesion.  There is an implant of  endometriosis along the left uterosacral ligament.  Tubes and ovaries are  normal bilaterally.  Endometrial canal is without abnormal structure.  Fallopian tube  ostia identified bilaterally.   PROCEDURE:  The patient was taken to the operating room, where she is  identified and placed in dorsal supine position and general anesthesia was  induced with endotracheal intubation.  She was then placed in the dorsal  lithotomy position, where she was prepped abdominally and vaginally, bladder  straight catheterized and she was draped in sterile fashion.  Bivalve  speculum is placed in the vagina.  Anterior cervical lip was injected with  1% Xylocaine and grasped with single-tooth tenaculum.  Paracervical block  was placed at the 2, 4, 5, 7, 8 and 10 o'clock positions with approximately  20 mL total of 1% plain Xylocaine.  Cervix was gently progressively dilated  to 27 dilator.  Diagnostic hysteroscope was placed in the endocervical canal  and advanced under direct visualization using sorbitol distending media.  The above findings are noted.  Hysteroscope was withdrawn and sharp  curettage was carried out.  Single-tooth tenaculum was replaced with Hulka  tenaculum.  Attention was turned to the abdomen.  The infraumbilical and  suprapubic areas in the midline are injected with 1/2% plain Marcaine.  A  small infraumbilical incision is made and a disposable Veress needle was  placed.  Normal syringe and drop test was noted.  Two liters of gas were  insufflated to low pressure with good tympany in the right upper quadrant.  Veress needle was removed.  A 10-11 disposable blade less trocar sleeve was  then placed using direct visualization with a diagnostic laparoscopic.  This  then replaced with the operative laparoscopic.  Small suprapubic incision  was made and a 5 mm disposable blade less trocar sleeve was placed under  direct visualization without difficulty.  Adhesions to the anterior  abdominal wall  were taken down with bipolar cautery and then sharp  dissection at the insertion of the anterior abdominal wall.  It was well  clear of the bowel.  The  adhesions of the anterior cul-de-sac are taken down  bluntly without difficulty.  The implants on the left uterosacral ligament  are ablated with bipolar cautery.  Good hemostasis was noted.  Suprapubic  trocar sleeve was removed and good hemostasis was noted.  Pneumoperitoneum  is reduced.  The umbilical trocar sleeve was removed.  The umbilical  incision was closed with 3-0 Vicryl suture in mattress fashion to control  subcutaneous bleeders.  Dermabond was then placed on both incisions.  Hulka  tenaculum was removed and good hemostasis was noted.  All counts correct.  The patient is awakened and taken to recovery room in stable condition.      Guy Sandifer Henderson Cloud, M.D.  Electronically Signed     JET/MEDQ  D:  10/01/2005  T:  10/01/2005  Job:  [id number]

## 2010-08-22 NOTE — H&P (Signed)
NAMEDEEPA, Barbara [name]             ACCOUNT NO.:  [id number]   MEDICAL RECORD NO.:  [id number]          PATIENT TYPE:  AMB   LOCATION:                                [facility]   PHYSICIAN:  Guy Sandifer. Henderson Cloud, M.D.      DATE OF BIRTH:   DATE OF ADMISSION:  10/01/2005  DATE OF DISCHARGE:                                HISTORY & PHYSICAL   CHIEF COMPLAINT:  Heavy, painful menses.   HISTORY OF PRESENT ILLNESS:  Patient is a 30 year old married white female,  G4, P3, who has increasingly heavy and painful menses.  After discussion of  the options, she is being admitted for laparoscopy, hysteroscopy with D&C.  Potential risks and complications have been discussed preoperatively.   PAST MEDICAL HISTORY:  Migraine headaches.   PAST SURGICAL HISTORY:  Negative.   OBSTETRICAL HISTORY:  Cesarean section x3.   FAMILY HISTORY:  Coronary artery disease, paternal grandfather.  Chronic  hypertension, maternal great-grandfather.   MEDICATIONS:  None.   No known drug allergies.   SOCIAL HISTORY:  Denies tobacco, alcohol, or drug abuse.   REVIEW OF SYSTEMS:  NEURO:  History of headache, as above.  CARDIAC:  Denies  chest pain.  PULMONARY:  Denies shortness of breath.  GI:  Denies recent  changes in bowel habits.   PHYSICAL EXAMINATION:  VITAL SIGNS:  Height 5 feet 7 inches.  Weight 228-1/2  pounds.  Blood pressure 148/88.  NECK:  Without thyromegaly.  LUNGS:  Clear to auscultation.  HEART:  Regular rate and rhythm.  BACK:  Without CVA tenderness.  BREASTS:  Without mass, traction, or discharge.  PELVIC:  Vulva, vagina, cervix without lesion.  Uterus, upper normal size.  Mobile, nontender.  Adnexa nontender without palpable masses.  EXTREMITIES:  Grossly within normal limits.  NEUROLOGIC:  Grossly within normal limits.   ASSESSMENT:  Menorrhagia, dysmenorrhea.   PLAN:  Laparoscopy, hysteroscopy, D&C.      Guy Sandifer Henderson Cloud, M.D.  Electronically Signed     JET/MEDQ  D:   09/28/2005  T:  09/28/2005  Job:  [id number]

## 2010-08-22 NOTE — H&P (Signed)
NAMEMARCEDES, TECH             ACCOUNT NO.:  [id number]   MEDICAL RECORD NO.:  [id number]          PATIENT TYPE:  INP   LOCATION:  NA                            [facility]   PHYSICIAN:  Guy Sandifer. Henderson Cloud, M.D. DATE OF BIRTH:  22-May-1980   DATE OF ADMISSION:  10/24/2004  DATE OF DISCHARGE:                                HISTORY & PHYSICAL   REASON FOR ADMISSION:  Desires repeat cesarean section.   HISTORY OF PRESENT ILLNESS:  This patient is a 30 year old married female,  G4, P2, with an EDC of November 02, 2004, who has had two previous cesarean  sections.  She is scheduled for #3.  Prenatal care has been complicated by a  history of low amniotic fluid with a previous pregnancy.  Antiphospholipid  antibodies and lupus anticoagulant were negative this pregnancy.  Serial  ultrasounds have revealed a normal AFI.   Past medical history, past surgical history, family history, and social  history, see prenatal history and physical.   MEDICATIONS:  Prenatal vitamins.   ALLERGIES:  No known drug allergies.   REVIEW OF SYMPTOMS:  NEUROLOGIC:  The patient denies recent, though she does  have history of migraines.  CARDIAC:  Denies chest pain.  PULMONARY:  Denies  shortness of breath.   PHYSICAL EXAMINATION:  VITAL SIGNS:  Height 5 feet 7 inches, weight 260  pounds, blood pressure 120/74.  HEENT:  Without thyromegaly.  LUNGS:  Clear to auscultation.  HEART:  Regular rate and rhythm.  BACK:  Without CVA tenderness.  BREASTS:  Not examined.  ABDOMEN:  Gravid, nontender.  PELVIC:  Cervix is closed, 50% effaced, -4 station.  EXTREMITIES:  Grossly within normal limits.  NEUROLOGIC EXAM:  Grossly within normal limits.   ASSESSMENT:  Intrauterine pregnancy at term with previous cesarean section x  2.   PLAN:  Repeat cesarean section.      Guy Sandifer Henderson Cloud, M.D.  Electronically Signed     JET/MEDQ  D:  10/23/2004  T:  10/23/2004  Job:  [id number]

## 2010-08-22 NOTE — Op Note (Signed)
Barbara [name], Barbara [name]                       ACCOUNT NO.:  [id number]   MEDICAL RECORD NO.:  [id number]                   PATIENT TYPE:  INP   LOCATION:  9142                                 [facility]   PHYSICIAN:  Tracie Harrier, M.D.              DATE OF BIRTH:  [birth year]   DATE OF PROCEDURE:  12/12/2001  DATE OF DISCHARGE:                                 OPERATIVE REPORT   PREOPERATIVE DIAGNOSES:  1. Intrauterine pregnancy at term.  2. Repeat cesarean section.  3. Induction of labor, failed.  4. History of macrosomia.   POSTOPERATIVE DIAGNOSES:  1. Intrauterine pregnancy at term.  2. Repeat cesarean section.  3. Induction of labor, failed.  4. History of macrosomia.   PROCEDURE:  Repeat low transverse cesarean section.   SURGEON:  Tracie Harrier, M.D.   ASSISTANT:   ANESTHESIA:  Spinal.   COMPLICATIONS:  800 cc.   FINDINGS:  At 1530 through a low transverse incision, a viable female infant  was delivered from a vertex presentation.  Weight pending.  Apgars 8 and 9.  The pelvis was visualized at the time of surgery and noted to be normal.   DESCRIPTION OF PROCEDURE:  The patient was taken to the operating room where  a spinal anesthetic was administered.  The patient was placed on the  operating table in the left lateral tilt position.  The abdomen was prepped  and draped in the usual sterile fashion with Betadine and sterile drapes.  A  Foley catheter was inserted.  The abdomen was then entered through a  Pfannenstiel incision and carried down sharply in the usual fashion.  The  peritoneum was atraumatically entered.  The vesicouterine peritoneum  overlying the lower uterine segment was incised and a bladder flap was  bluntly and sharply created over the lower uterine segment.  A bladder blade  was then placed behind the bladder to ensure its protection during the  procedure.  The uterus was entered through a low transverse incision and  carried out  laterally using the operating fingers.  The membranes were  entered with clear fluid noted.  The vertex was elevated into the incision  and delivered promptly and easily at 1536.  A nuchal cord x1 was reduced  easily.  The oral and nasopharynx was thoroughly bulb suctioned, and the  cord doubly clamped and cut.  The baby was handed promptly to the  pediatricians.  The baby was a female infant who did well.  Apgars were 8  and 9.  Weight is pending.   The placenta was then manually extracted intact with three vessel cord  without difficulty.  The interior of the uterus wiped clean thoroughly with  a wet sponge.  The uterine incision was then closed in a two layer fashion,  the first layer a running locking interlocking suture of #1 Vicryl.  A  second imbricating suture was placed across  the primary suture line with a  running stitch of #1 Vicryl as well.  Good hemostasis was noted.  The pelvis  was then thoroughly irrigated with copious amounts of irrigant and noted to  be hemostatic.  The pelvis was visualized and noted to be normal.   The rectus muscle and anterior peritoneum was then closed with a running  stitch of #1 Vicryl.  The subfascial layers were hemostatic.  The fascia was  then closed with one suture of #1 Vicryl in a running fashion.  Good  hemostasis was noted in the subcutaneous areas.  This was irrigated.  The  skin was reapproximated with staples, and a sterile dressing applied.   Final sponge, needle and instrument count was correct x3.  There were no  perioperative complications.                                                Tracie Harrier, M.D.    REG/MEDQ  D:  12/12/2001  T:  12/12/2001  Job:  [id number]

## 2010-08-22 NOTE — Op Note (Signed)
NAMESHAKEVIA, SARRIS             ACCOUNT NO.:  [id number]   MEDICAL RECORD NO.:  [id number]          PATIENT TYPE:  INP   LOCATION:  9199                          FACILITY:  [facility]   PHYSICIAN:  Guy Sandifer. Henderson Cloud, M.D. DATE OF BIRTH:  1981/01/11   DATE OF PROCEDURE:  10/24/2004  DATE OF DISCHARGE:                                 OPERATIVE REPORT   PREOPERATIVE DIAGNOSES:  1.  Intrauterine pregnancy at term.  2.  Previous cesarean section.  3.  Desires repeat.   POSTOPERATIVE DIAGNOSES:  1.  Intrauterine pregnancy at term.  2.  Previous cesarean section.  3.  Desires repeat.   PROCEDURE:  Low transverse cesarean section.   SURGEON:  Guy Sandifer. Henderson Cloud, M.D.   ANESTHESIA:  Spinal.   ESTIMATED BLOOD LOSS:  600 mL.   FINDINGS:  A viable female infant, Apgars of 8 and 9 at one and five minutes,  respectively.  Birth weight 6 pounds 14 ounces.  Arterial cord pH 7.31.   INDICATIONS AND CONSENT:  The patient is a 30 year old married white female,  G2, P2, with an EDC of November 02, 2004, with two previous cesarean sections.  She is admitted for repeat.  The potential risks and complications have been  reviewed preoperatively, including but not limited to infection; bowel,  bladder or ureteral damage; bleeding requiring transfusion of blood products  with possible transfusion reaction, HIV and hepatitis acquisition; DVT, PE  and pneumonia.  All questions are answered and consent is signed on the  chart.   PROCEDURE:  The patient is taken to the operating room, where she is  identified, spinal anesthetic is placed, and she is placed in the dorsal  supine position with a 15 degree left lateral wedge.  The panniculus is then  taped upward.  She is then prepped, a Foley catheter is placed in the  bladder as a drain, and she is draped in a sterile fashion.  After testing  for adequate spinal anesthesia, the skin is entered through the previous  scars and a Pfannenstiel incision and dissection  is carried out in layers to  the peritoneum.  The peritoneum is incised and extended superiorly and  inferiorly.  The vesicouterine peritoneum is taken down cephalolaterally,  the bladder flap is developed, and the bladder blade is placed.  The uterus  is incised in a low transverse manner and the uterine cavity is entered  bluntly with a hemostat.  The uterine incision is then extended  cephalolaterally with the fingers.  Artificial rupture of membranes for  clear fluid is carried out.  Vertex is delivered.  The oropharynx and  nasopharynx were suctioned.  The remainder of the baby is delivered.  A good  cry and tone is noted.  Cord is clamped and cut.  Baby is handed to the  waiting pediatrics team.  The placenta is manually delivered.  The uterine  cavity is clean.  The uterus is closed in two running, locking, imbricating  layers of 0 Monocryl suture, which achieves good hemostasis.  Tubes and  ovaries palpate normally.  The anterior peritoneum is closed in  running  fashion with 0 Monocryl suture, which is also used  to reapproximate the pyramidalis muscle in the midline.  Anterior rectus  fascia is closed in running fashion with 0 PDS, and the skin is closed with  clips.  All sponge, instrument and needle counts are correct and the patient  is transferred to the recovery room in stable condition.      Guy Sandifer Henderson Cloud, M.D.  Electronically Signed     JET/MEDQ  D:  10/24/2004  T:  10/24/2004  Job:  [id number]

## 2010-08-22 NOTE — Discharge Summary (Signed)
NAMECANDACE, [name]             ACCOUNT NO.:  [id number]   MEDICAL RECORD NO.:  [id number]          PATIENT TYPE:  INP   LOCATION:  9134                          FACILITY:  [facility]   PHYSICIAN:  Duke Salvia. Marcelle Overlie, M.D.DATE OF BIRTH:  31-May-1980   DATE OF ADMISSION:  10/24/2004  DATE OF DISCHARGE:  10/26/2004                                 DISCHARGE SUMMARY   ADMITTING DIAGNOSES:  1.  Intrauterine pregnancy at term.  2.  Previous cesarean section, desires repeat.   DISCHARGE DIAGNOSES:  1.  Status post low transverse cesarean section.  2.  Viable female infant.   PROCEDURE:  Repeat low transverse cesarean section.   REASON FOR ADMISSION:  Please see dictated H&P.   HOSPITAL COURSE:  Patient is a 30 year old white married female gravida 4,  para 2 that presented to [facility] for a scheduled  cesarean section.  Patient had had two previous cesarean deliveries and  desired repeat.  On the morning of admission patient was taken to the  operating room where spinal anesthesia was administered without difficulty.  A low transverse incision was made with delivery of a viable female infant  weighing 6 pounds 14 ounces with Apgars of 8 at one minute, 9 at five  minutes.  Arterial cord pH was 7.31.  Patient tolerated procedure well and  was taken to the recovery room in stable condition.  On postoperative day  one patient was without complaint.  Vital signs were stable.  She was  afebrile.  Abdomen was soft.  Fundus firm and nontender.  Laboratory  findings revealed hemoglobin of 9.7.  On postoperative day two patient did  desire early discharge.  Vital signs were stable.  She was afebrile.  Fundus  was firm and nontender.  Abdominal dressing had been removed revealing an  incision that was clean, dry, and intact.  Patient was tolerating a regular  diet without complaints of nausea, vomiting.  Discharge instructions were  reviewed with patient and she was later discharged  home.   CONDITION ON DISCHARGE:  Good.   DIET:  Regular, as tolerated.   ACTIVITY:  No heavy lifting.  No driving x2 weeks.  No vaginal entry.   FOLLOW-UP:  Patient is to follow up in the office in two to three days for  staple removal.  She is to call for temperature greater than 100 degrees,  persistent nausea and vomiting, heavy vaginal bleeding, and/or redness or  drainage from the incisional site.   DISCHARGE MEDICATIONS:  1.  Tylox #30 one p.o. every four to six hours p.r.n.  2.  Motrin 600 mg every six hours.  3.  Prenatal vitamins one p.o. daily.  4.  Colace one p.o. daily p.r.n.      Barbara [name], N.P.      Richard M. Marcelle Overlie, M.D.  Electronically Signed    CC/MEDQ  D:  11/30/2004  T:  12/01/2004  Job:  [id number]

## 2010-09-05 ENCOUNTER — Ambulatory Visit (HOSPITAL_COMMUNITY)
Admission: RE | Admit: 2010-09-05 | Discharge: 2010-09-05 | Disposition: A | Discharge status: Home / Self Care | Payer: Medicaid Other | Source: Ambulatory Visit | Attending: Family Medicine | Admitting: Family Medicine

## 2010-09-05 ENCOUNTER — Other Ambulatory Visit: Payer: Self-pay | Admitting: Family Medicine

## 2010-09-05 DIAGNOSIS — M79669 Pain in unspecified lower leg: Secondary | ICD-10-CM

## 2010-09-05 DIAGNOSIS — M79609 Pain in unspecified limb: Secondary | ICD-10-CM

## 2010-12-31 LAB — CBC
Hemoglobin: 13
MCHC: 33.4
MCV: 80.2
RBC: 4.86
WBC: 12.1 — ABNORMAL HIGH

## 2010-12-31 LAB — URINALYSIS, ROUTINE W REFLEX MICROSCOPIC
Glucose, UA: NEGATIVE
Hgb urine dipstick: NEGATIVE
Ketones, ur: NEGATIVE
Protein, ur: NEGATIVE
pH: 5.5

## 2011-01-02 LAB — CBC
HCT: 41
MCHC: 32.3
MCV: 81.7
Platelets: 288
RBC: 5.02
WBC: 11.3 — ABNORMAL HIGH

## 2011-01-05 LAB — URINE CULTURE: Colony Count: >=100000

## 2011-01-05 LAB — POCT PREGNANCY, URINE: Preg Test, Ur: NEGATIVE

## 2011-01-05 LAB — CBC
HCT: 36.1
Hemoglobin: 11.8 — ABNORMAL LOW
RBC: 4.41

## 2011-01-05 LAB — URINE MICROSCOPIC-ADD ON

## 2011-01-05 LAB — URINALYSIS, ROUTINE W REFLEX MICROSCOPIC
Bilirubin Urine: NEGATIVE
Glucose, UA: NEGATIVE
Ketones, ur: NEGATIVE
Protein, ur: 30 — AB
pH: 6

## 2011-01-06 LAB — COMPREHENSIVE METABOLIC PANEL
ALT: 11
Albumin: 2.2 — ABNORMAL LOW
Alkaline Phosphatase: 110
BUN: 3 — ABNORMAL LOW
CO2: 23
CO2: 29
Calcium: 7.9 — ABNORMAL LOW
Calcium: 7.9 — ABNORMAL LOW
Calcium: 7.9 — ABNORMAL LOW
Creatinine, Ser: 0.7
Creatinine, Ser: 0.71
Creatinine, Ser: 1.12
GFR calc Af Amer: >60
GFR calc Af Amer: >60
GFR calc non Af Amer: >60
Glucose, Bld: 113 — ABNORMAL HIGH
Glucose, Bld: 89
Potassium: 3.3 — ABNORMAL LOW
Total Bilirubin: 0.4
Total Protein: 5.6 — ABNORMAL LOW
Total Protein: 5.7 — ABNORMAL LOW

## 2011-01-06 LAB — URINALYSIS, ROUTINE W REFLEX MICROSCOPIC
Bilirubin Urine: NEGATIVE
Glucose, UA: NEGATIVE
Nitrite: NEGATIVE
Specific Gravity, Urine: 1.02
pH: 5.5

## 2011-01-06 LAB — CBC
HCT: 26.5 — ABNORMAL LOW
HCT: 29.7 — ABNORMAL LOW
Hemoglobin: 9 — ABNORMAL LOW
Hemoglobin: 9.6 — ABNORMAL LOW
MCHC: 32.1
MCHC: 32.5
MCHC: 32.5
MCHC: 32.6
MCHC: 33
MCV: 80.4
MCV: 80.7
MCV: 81.3
Platelets: 299
RBC: 3.41 — ABNORMAL LOW
RBC: 3.56 — ABNORMAL LOW
RBC: 3.68 — ABNORMAL LOW
RBC: 4.15
RDW: 15.2
RDW: 15.6 — ABNORMAL HIGH
RDW: 15.6 — ABNORMAL HIGH
WBC: 11.7 — ABNORMAL HIGH
WBC: 30.1 — ABNORMAL HIGH

## 2011-01-06 LAB — MAGNESIUM: Magnesium: 1.8

## 2011-01-06 LAB — CULTURE, ROUTINE-ABSCESS

## 2011-01-06 LAB — CLOSTRIDIUM DIFFICILE EIA: C difficile Toxins A+B, EIA: NEGATIVE

## 2011-01-06 LAB — BASIC METABOLIC PANEL
CO2: 25
CO2: 27
Calcium: 8.8
Chloride: 107
Chloride: 111
Creatinine, Ser: 0.77
Creatinine, Ser: 0.78
Creatinine, Ser: 1.07
GFR calc Af Amer: >60
GFR calc Af Amer: >60
GFR calc Af Amer: >60
GFR calc non Af Amer: >60
Glucose, Bld: 87
Potassium: 3.8
Sodium: 137
Sodium: 141

## 2011-01-06 LAB — DIFFERENTIAL
Basophils Relative: 0
Monocytes Relative: 0 — ABNORMAL LOW
Neutro Abs: 11.2 — ABNORMAL HIGH
Neutrophils Relative %: 95 — ABNORMAL HIGH

## 2011-01-06 LAB — CULTURE, BLOOD (SINGLE)

## 2011-01-06 LAB — URINE CULTURE

## 2011-01-06 LAB — URINE MICROSCOPIC-ADD ON

## 2011-01-21 LAB — CBC
HCT: 29.1 — ABNORMAL LOW
Hemoglobin: 10.3 — ABNORMAL LOW
Hemoglobin: 9.3 — ABNORMAL LOW
Platelets: 231
RBC: 4.17
RDW: 15.8 — ABNORMAL HIGH
WBC: 19.2 — ABNORMAL HIGH

## 2011-01-21 LAB — RAPID HIV SCREEN (WH-MAU): Rapid HIV Screen: NONREACTIVE

## 2011-01-21 LAB — RPR: RPR Ser Ql: NONREACTIVE

## 2011-01-22 LAB — URINE MICROSCOPIC-ADD ON

## 2011-01-22 LAB — URINE CULTURE

## 2011-01-22 LAB — URINALYSIS, ROUTINE W REFLEX MICROSCOPIC
Bilirubin Urine: NEGATIVE
Nitrite: POSITIVE — AB
Specific Gravity, Urine: 1.02
Urobilinogen, UA: 0.2
pH: 6

## 2013-06-26 HISTORY — PX: COLONOSCOPY: SHX174

## 2013-09-25 ENCOUNTER — Other Ambulatory Visit: Payer: Self-pay | Admitting: Physician Assistant

## 2013-09-25 ENCOUNTER — Ambulatory Visit
Admission: RE | Admit: 2013-09-25 | Discharge: 2013-09-25 | Disposition: A | Discharge status: Home / Self Care | Payer: Medicaid Other | Source: Ambulatory Visit | Attending: Physician Assistant | Admitting: Physician Assistant

## 2013-09-25 ENCOUNTER — Encounter (INDEPENDENT_AMBULATORY_CARE_PROVIDER_SITE_OTHER): Payer: Self-pay

## 2013-09-25 DIAGNOSIS — R52 Pain, unspecified: Secondary | ICD-10-CM

## 2014-05-25 ENCOUNTER — Other Ambulatory Visit: Payer: Self-pay | Admitting: Orthopedic Surgery

## 2014-05-25 DIAGNOSIS — M545 Low back pain: Secondary | ICD-10-CM

## 2014-06-09 ENCOUNTER — Ambulatory Visit
Admission: RE | Admit: 2014-06-09 | Discharge: 2014-06-09 | Disposition: A | Discharge status: Home / Self Care | Payer: Medicaid Other | Source: Ambulatory Visit | Attending: Orthopedic Surgery | Admitting: Orthopedic Surgery

## 2014-06-09 DIAGNOSIS — M545 Low back pain: Secondary | ICD-10-CM

## 2014-09-24 ENCOUNTER — Other Ambulatory Visit: Payer: Self-pay | Admitting: Family Medicine

## 2014-09-24 ENCOUNTER — Ambulatory Visit
Admission: RE | Admit: 2014-09-24 | Discharge: 2014-09-24 | Disposition: A | Discharge status: Home / Self Care | Payer: Medicaid Other | Source: Ambulatory Visit | Attending: Family Medicine | Admitting: Family Medicine

## 2014-09-24 DIAGNOSIS — R0789 Other chest pain: Secondary | ICD-10-CM

## 2014-10-05 DIAGNOSIS — M25372 Other instability, left ankle: Secondary | ICD-10-CM

## 2014-10-05 HISTORY — DX: Other instability, left ankle: M25.372

## 2014-11-02 ENCOUNTER — Encounter (HOSPITAL_BASED_OUTPATIENT_CLINIC_OR_DEPARTMENT_OTHER): Payer: Self-pay | Admitting: *Deleted

## 2014-11-05 ENCOUNTER — Encounter (HOSPITAL_BASED_OUTPATIENT_CLINIC_OR_DEPARTMENT_OTHER)
Admission: RE | Admit: 2014-11-05 | Discharge: 2014-11-05 | Disposition: A | Discharge status: Home / Self Care | Payer: Medicaid Other | Source: Ambulatory Visit | Attending: Orthopedic Surgery | Admitting: Orthopedic Surgery

## 2014-11-05 DIAGNOSIS — E669 Obesity, unspecified: Secondary | ICD-10-CM | POA: Insufficient documentation

## 2014-11-05 NOTE — Progress Notes (Signed)
Pt weight checked, higher than she had stated earlier.  Increase BMI  To 52.18. Explained to pt cut off was 50 and explained reasons.  She understood but not pleased. Explained I would call Dr Hewitt's office to  have her case to main hospital and the office would call her with new info.[phone] spoke with Toniann Fail in [facility] office, explained situation. She will tell Dr Victorino Dike and get pt moved  She will call pt with new info.

## 2014-11-21 MED ORDER — MUPIROCIN 2 % EX OINT: 1.0000 "application " | TOPICAL_OINTMENT | CUTANEOUS | Status: AC

## 2014-11-21 NOTE — Progress Notes (Signed)
Contacted pt for pre-op call and she states her surgery has been postponed until December. I called Dr. Laverta Baltimore office and left message for Toniann Fail to return my call.

## 2014-11-22 ENCOUNTER — Ambulatory Visit (HOSPITAL_BASED_OUTPATIENT_CLINIC_OR_DEPARTMENT_OTHER): Admission: RE | Admit: 2014-11-22 | Payer: Medicaid Other | Source: Ambulatory Visit | Admitting: Orthopedic Surgery

## 2014-11-22 HISTORY — DX: Migraine, unspecified, not intractable, without status migrainosus: G43.909

## 2014-11-22 HISTORY — DX: Gastro-esophageal reflux disease without esophagitis: K21.9

## 2014-11-22 HISTORY — DX: Essential (primary) hypertension: I10

## 2014-11-22 HISTORY — DX: Interstitial cystitis (chronic) without hematuria: N30.10

## 2014-11-22 HISTORY — DX: Other instability, left ankle: M25.372

## 2014-11-22 HISTORY — DX: Obesity, unspecified: E66.9

## 2014-11-22 SURGERY — ARTHROSCOPY, ANKLE, WITH RECONSTRUCTION
Anesthesia: General | Laterality: Left

## 2014-12-27 ENCOUNTER — Other Ambulatory Visit: Payer: Self-pay | Admitting: Orthopedic Surgery

## 2015-03-12 ENCOUNTER — Encounter (HOSPITAL_COMMUNITY)
Admission: RE | Admit: 2015-03-12 | Discharge: 2015-03-12 | Disposition: A | Discharge status: Home / Self Care | Payer: Medicaid Other | Source: Ambulatory Visit | Attending: Orthopedic Surgery | Admitting: Orthopedic Surgery

## 2015-03-12 ENCOUNTER — Other Ambulatory Visit: Payer: Self-pay

## 2015-03-12 ENCOUNTER — Encounter (HOSPITAL_COMMUNITY): Payer: Self-pay

## 2015-03-12 DIAGNOSIS — Z01818 Encounter for other preprocedural examination: Secondary | ICD-10-CM | POA: Insufficient documentation

## 2015-03-12 DIAGNOSIS — M659 Synovitis and tenosynovitis, unspecified: Secondary | ICD-10-CM | POA: Insufficient documentation

## 2015-03-12 DIAGNOSIS — Z01812 Encounter for preprocedural laboratory examination: Secondary | ICD-10-CM | POA: Insufficient documentation

## 2015-03-12 DIAGNOSIS — M25372 Other instability, left ankle: Secondary | ICD-10-CM | POA: Insufficient documentation

## 2015-03-12 HISTORY — DX: Pneumonia, unspecified organism: J18.9

## 2015-03-12 HISTORY — DX: Unspecified osteoarthritis, unspecified site: M19.90

## 2015-03-12 LAB — CBC
HEMATOCRIT: 42.7 % (ref 36.0–46.0)
HEMOGLOBIN: 13.7 g/dL (ref 12.0–15.0)
MCH: 27.2 pg (ref 26.0–34.0)
MCHC: 32.1 g/dL (ref 30.0–36.0)
MCV: 84.9 fL (ref 78.0–100.0)
Platelets: 321 10*3/uL (ref 150–400)
RBC: 5.03 MIL/uL (ref 3.87–5.11)
RDW: 14.6 % (ref 11.5–15.5)
WBC: 12.2 10*3/uL — ABNORMAL HIGH (ref 4.0–10.5)

## 2015-03-12 LAB — BASIC METABOLIC PANEL
ANION GAP: 11 (ref 5–15)
BUN: 14 mg/dL (ref 6–20)
CHLORIDE: 106 mmol/L (ref 101–111)
CO2: 23 mmol/L (ref 22–32)
Calcium: 9.1 mg/dL (ref 8.9–10.3)
Creatinine, Ser: 0.75 mg/dL (ref 0.44–1.00)
GFR calc Af Amer: >60 mL/min (ref >60)
GLUCOSE: 91 mg/dL (ref 65–99)
POTASSIUM: 4.1 mmol/L (ref 3.5–5.1)
Sodium: 140 mmol/L (ref 135–145)

## 2015-03-12 LAB — SURGICAL PCR SCREEN
MRSA, PCR: NEGATIVE
Staphylococcus aureus: POSITIVE — AB

## 2015-03-12 MED ORDER — SODIUM CHLORIDE 0.9 % IV SOLN: INTRAVENOUS | Status: DC

## 2015-03-12 NOTE — Pre-Procedure Instructions (Addendum)
Barbara [name]  03/12/2015      [id number] [place],  - [address] [place] [id number] Phone: [phone] Fax: [phone]    Your procedure is scheduled on 03-21-2015   Thursday   .  [facility].   Call this number if you have problems the morning of surgery:  [phone]   Remember:  Do not eat food or drink liquids after midnight.   Take these medicines the morning of surgery with A SIP OF WATER methocarbamol (Robaxin) if needed,protonix,Elmiron if needed   STOP all herbel meds, nsaids (aleve,naproxen,advil,ibuprofen) 5 days prior to surgery starting 03/16/15 including vitamins, aspirin   Do not wear jewelry, make-up or nail polish.  Do not wear lotions, powders, or perfumes.  You may not wear deodorant.  Do not shave 48 hours prior to surgery.   .  Do not bring valuables to the hospital.  [facility] is not responsible for any belongings or valuables.  Contacts, dentures or bridgework may not be worn into surgery.  Leave your suitcase in the car.  After surgery it may be brought to your room.  For patients admitted to the hospital, discharge time will be determined by your treatment team.  Patients discharged the day of surgery will not be allowed to drive home.    Special instructions:   Special Instructions: [place] - Preparing for Surgery  Before surgery, you can play an important role.  Because skin is not sterile, your skin needs to be as free of germs as possible.  You can reduce the number of germs on you skin by washing with CHG (chlorahexidine gluconate) soap before surgery.  CHG is an antiseptic cleaner which kills germs and bonds with the skin to continue killing germs even after washing.  Please DO NOT use if you have an allergy to CHG or antibacterial soaps.  If your skin becomes reddened/irritated stop using the CHG and inform your nurse when you arrive at Short  Stay.  Do not shave (including legs and underarms) for at least 48 hours prior to the first CHG shower.  You may shave your face.  Please follow these instructions carefully:   1.  Shower with CHG Soap the night before surgery and the morning of Surgery.  2.  If you choose to wash your hair, wash your hair first as usual with your normal shampoo.  3.  After you shampoo, rinse your hair and body thoroughly to remove the Shampoo.  4.  Use CHG as you would any other liquid soap.  You can apply chg directly  to the skin and wash gently with scrungie or a clean washcloth.  5.  Apply the CHG Soap to your body ONLY FROM THE NECK DOWN.  Do not use on open wounds or open sores.  Avoid contact with your eyes ears, mouth and genitals (private parts).  Wash genitals (private parts)       with your normal soap.  6.  Wash thoroughly, paying special attention to the area where your surgery will be performed.  7.  Thoroughly rinse your body with warm water from the neck down.  8.  DO NOT shower/wash with your normal soap after using and rinsing off the CHG Soap.  9.  Pat yourself dry with a clean towel.            10.  Wear clean pajamas.  11.  Place clean sheets on your bed the night of your first shower and do not sleep with pets.  Day of Surgery  Do not apply any lotions/deodorants the morning of surgery.  Please wear clean clothes to the hospital/surgery center.  Please read over the following fact sheets that you were given. Pain Booklet and Surgical Site Infection Prevention

## 2015-03-20 MED ORDER — DEXTROSE 5 % IV SOLN
3.0000 g | INTRAVENOUS | Status: AC
  Administered 2015-03-21: 3 g via INTRAVENOUS
  Filled 2015-03-20: qty 3000

## 2015-03-21 ENCOUNTER — Encounter (HOSPITAL_COMMUNITY): Payer: Self-pay | Admitting: Anesthesiology

## 2015-03-21 ENCOUNTER — Ambulatory Visit (HOSPITAL_COMMUNITY): Payer: Medicaid Other | Admitting: Anesthesiology

## 2015-03-21 ENCOUNTER — Encounter (HOSPITAL_COMMUNITY)
Admission: RE | Disposition: A | Discharge status: Home / Self Care | Payer: Self-pay | Source: Ambulatory Visit | Attending: Orthopedic Surgery

## 2015-03-21 ENCOUNTER — Ambulatory Visit (HOSPITAL_COMMUNITY)
Admission: RE | Admit: 2015-03-21 | Discharge: 2015-03-21 | Disposition: A | Discharge status: Home / Self Care | Payer: Medicaid Other | Source: Ambulatory Visit | Attending: Orthopedic Surgery | Admitting: Orthopedic Surgery

## 2015-03-21 DIAGNOSIS — M65872 Other synovitis and tenosynovitis, left ankle and foot: Secondary | ICD-10-CM | POA: Insufficient documentation

## 2015-03-21 DIAGNOSIS — I10 Essential (primary) hypertension: Secondary | ICD-10-CM | POA: Diagnosis not present

## 2015-03-21 DIAGNOSIS — K219 Gastro-esophageal reflux disease without esophagitis: Secondary | ICD-10-CM | POA: Insufficient documentation

## 2015-03-21 DIAGNOSIS — M25372 Other instability, left ankle: Secondary | ICD-10-CM | POA: Insufficient documentation

## 2015-03-21 HISTORY — PX: ANKLE ARTHROSCOPY: SHX545

## 2015-03-21 SURGERY — ARTHROSCOPY, ANKLE
Anesthesia: General | Site: Ankle | Laterality: Left

## 2015-03-21 MED ORDER — ONDANSETRON HCL 4 MG/2ML IJ SOLN
4.0000 mg | Freq: Once | INTRAMUSCULAR | Status: AC | PRN
  Administered 2015-03-21: 4 mg via INTRAVENOUS

## 2015-03-21 MED ORDER — ONDANSETRON HCL 4 MG/2ML IJ SOLN
INTRAMUSCULAR | Status: AC
  Filled 2015-03-21: qty 2

## 2015-03-21 MED ORDER — VANCOMYCIN HCL IN DEXTROSE 1-5 GM/200ML-% IV SOLN
INTRAVENOUS | Status: AC
  Filled 2015-03-21: qty 200

## 2015-03-21 MED ORDER — OXYCODONE-ACETAMINOPHEN 5-325 MG PO TABS
2.0000 | ORAL_TABLET | Freq: Once | ORAL | Status: AC
  Administered 2015-03-21: 2 via ORAL

## 2015-03-21 MED ORDER — SENNA 8.6 MG PO TABS: 2.0000 | ORAL_TABLET | Freq: Two times a day (BID) | ORAL | Status: DC

## 2015-03-21 MED ORDER — FENTANYL CITRATE (PF) 250 MCG/5ML IJ SOLN
INTRAMUSCULAR | Status: AC
  Filled 2015-03-21: qty 5

## 2015-03-21 MED ORDER — 0.9 % SODIUM CHLORIDE (POUR BTL) OPTIME
TOPICAL | Status: DC | PRN
  Administered 2015-03-21: 1000 mL

## 2015-03-21 MED ORDER — LIDOCAINE HCL (CARDIAC) 20 MG/ML IV SOLN
INTRAVENOUS | Status: AC
  Filled 2015-03-21: qty 5

## 2015-03-21 MED ORDER — DEXAMETHASONE SODIUM PHOSPHATE 4 MG/ML IJ SOLN
INTRAMUSCULAR | Status: DC | PRN
  Administered 2015-03-21: 4 mg via INTRAVENOUS

## 2015-03-21 MED ORDER — EPINEPHRINE HCL 1 MG/ML IJ SOLN
INTRAMUSCULAR | Status: AC
  Filled 2015-03-21: qty 1

## 2015-03-21 MED ORDER — HYDROMORPHONE HCL 1 MG/ML IJ SOLN
0.5000 mg | INTRAMUSCULAR | Status: DC | PRN
  Administered 2015-03-21 (×2): 0.5 mg via INTRAVENOUS

## 2015-03-21 MED ORDER — DEXAMETHASONE SODIUM PHOSPHATE 4 MG/ML IJ SOLN
INTRAMUSCULAR | Status: AC
  Filled 2015-03-21: qty 1

## 2015-03-21 MED ORDER — MEPERIDINE HCL 25 MG/ML IJ SOLN: 6.2500 mg | INTRAMUSCULAR | Status: DC | PRN

## 2015-03-21 MED ORDER — OXYCODONE HCL 5 MG PO TABS: 5.0000 mg | ORAL_TABLET | ORAL | Status: DC | PRN

## 2015-03-21 MED ORDER — BACITRACIN ZINC 500 UNIT/GM EX OINT
TOPICAL_OINTMENT | CUTANEOUS | Status: AC
  Filled 2015-03-21: qty 28.35

## 2015-03-21 MED ORDER — FENTANYL CITRATE (PF) 100 MCG/2ML IJ SOLN
INTRAMUSCULAR | Status: DC | PRN
  Administered 2015-03-21: 50 ug via INTRAVENOUS
  Administered 2015-03-21 (×2): 25 ug via INTRAVENOUS
  Administered 2015-03-21: 50 ug via INTRAVENOUS

## 2015-03-21 MED ORDER — BUPIVACAINE-EPINEPHRINE (PF) 0.5% -1:200000 IJ SOLN
INTRAMUSCULAR | Status: DC | PRN
  Administered 2015-03-21: 30 mL via PERINEURAL

## 2015-03-21 MED ORDER — ONDANSETRON HCL 4 MG/2ML IJ SOLN
INTRAMUSCULAR | Status: DC | PRN
  Administered 2015-03-21: 4 mg via INTRAVENOUS

## 2015-03-21 MED ORDER — PROPOFOL 10 MG/ML IV BOLUS
INTRAVENOUS | Status: AC
  Filled 2015-03-21: qty 40

## 2015-03-21 MED ORDER — MIDAZOLAM HCL 5 MG/5ML IJ SOLN
INTRAMUSCULAR | Status: DC | PRN
Start: 2015-03-21 — End: 2015-03-21
  Administered 2015-03-21: 0.5 mg via INTRAVENOUS
  Administered 2015-03-21: 1 mg via INTRAVENOUS
  Administered 2015-03-21: 0.5 mg via INTRAVENOUS

## 2015-03-21 MED ORDER — MIDAZOLAM HCL 2 MG/2ML IJ SOLN
INTRAMUSCULAR | Status: AC
  Filled 2015-03-21: qty 2

## 2015-03-21 MED ORDER — OXYCODONE-ACETAMINOPHEN 5-325 MG PO TABS
ORAL_TABLET | ORAL | Status: AC
  Filled 2015-03-21: qty 2

## 2015-03-21 MED ORDER — PROPOFOL 10 MG/ML IV BOLUS
INTRAVENOUS | Status: DC | PRN
Start: 2015-03-21 — End: 2015-03-21
  Administered 2015-03-21: 200 mg via INTRAVENOUS
  Administered 2015-03-21: 50 mg via INTRAVENOUS

## 2015-03-21 MED ORDER — CHLORHEXIDINE GLUCONATE 4 % EX LIQD: 60.0000 mL | Freq: Once | CUTANEOUS | Status: DC

## 2015-03-21 MED ORDER — ASPIRIN EC 325 MG PO TBEC: 325.0000 mg | DELAYED_RELEASE_TABLET | Freq: Every day | ORAL | Status: DC

## 2015-03-21 MED ORDER — LACTATED RINGERS IV SOLN
INTRAVENOUS | Status: DC | PRN
  Administered 2015-03-21 (×2): via INTRAVENOUS

## 2015-03-21 MED ORDER — BUPIVACAINE HCL (PF) 0.25 % IJ SOLN
INTRAMUSCULAR | Status: AC
Start: 2015-03-21 — End: 2015-03-21
  Filled 2015-03-21: qty 30

## 2015-03-21 MED ORDER — SODIUM CHLORIDE 0.9 % IR SOLN
Status: DC | PRN
  Administered 2015-03-21: 6000 mL

## 2015-03-21 MED ORDER — HYDROMORPHONE HCL 1 MG/ML IJ SOLN
INTRAMUSCULAR | Status: DC
Start: 2015-03-21 — End: 2015-03-21
  Filled 2015-03-21: qty 1

## 2015-03-21 MED ORDER — DOCUSATE SODIUM 100 MG PO CAPS: 100.0000 mg | ORAL_CAPSULE | Freq: Two times a day (BID) | ORAL | Status: DC

## 2015-03-21 MED ORDER — HYDROMORPHONE HCL 1 MG/ML IJ SOLN: 0.2500 mg | INTRAMUSCULAR | Status: DC | PRN

## 2015-03-21 MED ORDER — LIDOCAINE HCL (CARDIAC) 20 MG/ML IV SOLN
INTRAVENOUS | Status: DC | PRN
  Administered 2015-03-21: 100 mg via INTRAVENOUS

## 2015-03-21 SURGICAL SUPPLY — 81 items
ANCH SUT 1 SHRT SM RGD INSRTR (Anchor) ×2 IMPLANT
ANCHOR SUT 1.45 SZ 1 SHORT (Anchor) ×2 IMPLANT
BANDAGE ESMARK 6X9 LF (GAUZE/BANDAGES/DRESSINGS) IMPLANT
BLADE GREAT WHITE 4.2 (BLADE) ×1 IMPLANT
BLADE SURG 15 STRL LF DISP TIS (BLADE) IMPLANT
BLADE SURG 15 STRL SS (BLADE) ×2
BNDG CMPR 9X6 STRL LF SNTH (GAUZE/BANDAGES/DRESSINGS) ×1
BNDG COHESIVE 4X5 TAN STRL (GAUZE/BANDAGES/DRESSINGS) ×2 IMPLANT
BNDG COHESIVE 6X5 TAN STRL LF (GAUZE/BANDAGES/DRESSINGS) ×2 IMPLANT
BNDG ESMARK 6X9 LF (GAUZE/BANDAGES/DRESSINGS) ×2
BNDG GAUZE ELAST 4 BULKY (GAUZE/BANDAGES/DRESSINGS) ×3 IMPLANT
BRUSH SCRUB DISP (MISCELLANEOUS) ×1 IMPLANT
BUR 3.5 LG SPHERICAL (BURR) IMPLANT
BUR CUDA 2.9 (BURR) ×2 IMPLANT
BUR GATOR 2.9 (BURR) ×2 IMPLANT
BUR OVAL 4.0 (BURR) IMPLANT
BUR SPHERICAL 2.9 (BURR) ×2 IMPLANT
BURR 3.5 LG SPHERICAL (BURR)
CANISTER SUCT 3000ML PPV (MISCELLANEOUS) ×1 IMPLANT
COTTON STERILE ROLL (GAUZE/BANDAGES/DRESSINGS) IMPLANT
COVER SURGICAL LIGHT HANDLE (MISCELLANEOUS) ×1 IMPLANT
CUFF TOURNIQUET SINGLE 34IN LL (TOURNIQUET CUFF) IMPLANT
CUFF TOURNIQUET SINGLE 44IN (TOURNIQUET CUFF) ×1 IMPLANT
DECANTER SPIKE VIAL GLASS SM (MISCELLANEOUS) IMPLANT
DRAPE ARTHROSCOPY W/POUCH 114 (DRAPES) ×2 IMPLANT
DRAPE OEC MINIVIEW 54X84 (DRAPES) ×1 IMPLANT
DRAPE SURG 17X23 STRL (DRAPES) ×2 IMPLANT
DRAPE U-SHAPE 47X51 STRL (DRAPES) ×2 IMPLANT
DRSG MEPITEL 4X7.2 (GAUZE/BANDAGES/DRESSINGS) ×1 IMPLANT
DRSG PAD ABDOMINAL 8X10 ST (GAUZE/BANDAGES/DRESSINGS) ×2 IMPLANT
ELECT REM PT RETURN 9FT ADLT (ELECTROSURGICAL) ×2
ELECTRODE REM PT RTRN 9FT ADLT (ELECTROSURGICAL) ×1 IMPLANT
GAUZE SPONGE 4X4 12PLY STRL (GAUZE/BANDAGES/DRESSINGS) ×2 IMPLANT
GAUZE XEROFORM 1X8 LF (GAUZE/BANDAGES/DRESSINGS) ×1 IMPLANT
GLOVE BIO SURGEON STRL SZ7 (GLOVE) ×4 IMPLANT
GLOVE BIOGEL PI IND STRL 7.5 (GLOVE) ×1 IMPLANT
GLOVE BIOGEL PI IND STRL 8 (GLOVE) ×2 IMPLANT
GLOVE BIOGEL PI INDICATOR 7.5 (GLOVE) ×1
GLOVE BIOGEL PI INDICATOR 8 (GLOVE) ×2
GLOVE SKINSENSE NS SZ8.0 LF (GLOVE) ×1
GLOVE SKINSENSE STRL SZ8.0 LF (GLOVE) ×1 IMPLANT
GOWN STRL REUS W/ TWL LRG LVL3 (GOWN DISPOSABLE) ×1 IMPLANT
GOWN STRL REUS W/ TWL XL LVL3 (GOWN DISPOSABLE) ×1 IMPLANT
GOWN STRL REUS W/TWL LRG LVL3 (GOWN DISPOSABLE) ×2
GOWN STRL REUS W/TWL XL LVL3 (GOWN DISPOSABLE) ×2
KIT BASIN OR (CUSTOM PROCEDURE TRAY) ×2 IMPLANT
KIT ROOM TURNOVER OR (KITS) ×2 IMPLANT
MANIFOLD NEPTUNE II (INSTRUMENTS) ×1 IMPLANT
NDL HYPO 25X1 1.5 SAFETY (NEEDLE) IMPLANT
NEEDLE HYPO 25X1 1.5 SAFETY (NEEDLE) IMPLANT
NS IRRIG 1000ML POUR BTL (IV SOLUTION) ×2 IMPLANT
PACK ARTHROSCOPY DSU (CUSTOM PROCEDURE TRAY) ×2 IMPLANT
PAD ARMBOARD 7.5X6 YLW CONV (MISCELLANEOUS) ×4 IMPLANT
PAD CAST 4YDX4 CTTN HI CHSV (CAST SUPPLIES) IMPLANT
PADDING CAST ABS 4INX4YD NS (CAST SUPPLIES)
PADDING CAST ABS COTTON 4X4 ST (CAST SUPPLIES) ×1 IMPLANT
PADDING CAST COTTON 4X4 STRL (CAST SUPPLIES) ×4
PADDING CAST COTTON 6X4 STRL (CAST SUPPLIES) ×1 IMPLANT
PENCIL BUTTON HOLSTER BLD 10FT (ELECTRODE) ×1 IMPLANT
SET ARTHROSCOPY TUBING (MISCELLANEOUS) ×2
SET ARTHROSCOPY TUBING LN (MISCELLANEOUS) ×1 IMPLANT
SPLINT PLASTER CAST XFAST 5X30 (CAST SUPPLIES) IMPLANT
SPLINT PLASTER XFAST SET 5X30 (CAST SUPPLIES) ×1
SPONGE LAP 18X18 X RAY DECT (DISPOSABLE) ×1 IMPLANT
STOCKINETTE 6  STRL (DRAPES) ×1
STOCKINETTE 6 STRL (DRAPES) ×1 IMPLANT
STRAP ANKLE FOOT DISTRACTOR (ORTHOPEDIC SUPPLIES) ×2 IMPLANT
SUCTION FRAZIER TIP 10 FR DISP (SUCTIONS) IMPLANT
SUT ETHILON 3 0 PS 1 (SUTURE) ×1 IMPLANT
SUT ETHILON 4 0 PS 2 18 (SUTURE) ×1 IMPLANT
SUT MNCRL AB 3-0 PS2 18 (SUTURE) ×1 IMPLANT
SUT VIC AB 3-0 PS1 18 (SUTURE)
SUT VIC AB 3-0 PS1 18XBRD (SUTURE) IMPLANT
SYR BULB IRRIGATION 50ML (SYRINGE) ×1 IMPLANT
SYR CONTROL 10ML LL (SYRINGE) IMPLANT
TOWEL OR 17X24 6PK STRL BLUE (TOWEL DISPOSABLE) ×2 IMPLANT
TOWEL OR 17X26 10 PK STRL BLUE (TOWEL DISPOSABLE) ×2 IMPLANT
TUBE CONNECTING 12X1/4 (SUCTIONS) ×2 IMPLANT
UNDERPAD 30X30 INCONTINENT (UNDERPADS AND DIAPERS) ×1 IMPLANT
WAND HAND CNTRL MULTIVAC 90 (MISCELLANEOUS) ×1 IMPLANT
WATER STERILE IRR 1000ML POUR (IV SOLUTION) ×2 IMPLANT

## 2015-03-21 NOTE — Anesthesia Preprocedure Evaluation (Addendum)
Anesthesia Evaluation  Patient identified by MRN, date of birth, ID band Patient awake    Reviewed: Allergy & Precautions, NPO status , Patient's Chart, lab work & pertinent test results, reviewed documented beta blocker date and time   Airway Mallampati: II  TM Distance: >3 FB Neck ROM: Full    Dental  (+) Teeth Intact, Dental Advisory Given   Pulmonary    Pulmonary exam normal        Cardiovascular hypertension, Pt. on home beta blockers Normal cardiovascular exam     Neuro/Psych    GI/Hepatic GERD  Medicated and Controlled,  Endo/Other    Renal/GU      Musculoskeletal   Abdominal   Peds  Hematology   Anesthesia Other Findings   Reproductive/Obstetrics                           Anesthesia Physical Anesthesia Plan  ASA: II  Anesthesia Plan: General   Post-op Pain Management: GA combined w/ Regional for post-op pain   Induction: Intravenous  Airway Management Planned: LMA  Additional Equipment:   Intra-op Plan:   Post-operative Plan: Extubation in OR  Informed Consent: I have reviewed the patients History and Physical, chart, labs and discussed the procedure including the risks, benefits and alternatives for the proposed anesthesia with the patient or authorized representative who has indicated his/her understanding and acceptance.   Dental advisory given  Plan Discussed with: CRNA and Surgeon  Anesthesia Plan Comments:        Anesthesia Quick Evaluation

## 2015-03-21 NOTE — Discharge Instructions (Signed)
John Hewitt, MD °[facility] ° °Please read the following information regarding your care after surgery. ° °Medications  °You only need a prescription for the narcotic pain medicine (ex. oxycodone, Percocet, Norco).  All of the other medicines listed below are available over the counter. °X acetominophen (Tylenol) 650 mg every 4-6 hours as you need for minor pain °X oxycodone as prescribed for moderate to severe pain °?  ° °Narcotic pain medicine (ex. oxycodone, Percocet, Vicodin) will cause constipation.  To prevent this problem, take the following medicines while you are taking any pain medicine. °X docusate sodium (Colace) 100 mg twice a day X senna (Senokot) 2 tablets twice a day ° °X To help prevent blood clots, take an aspirin (325 mg) once a day for a month after surgery.  You should also get up every hour while you are awake to move around.   ° °Weight Bearing °X Do not bear any weight on the operated leg or foot. ° °Cast / Splint / Dressing °X Keep your splint or cast clean and dry.  Don’t put anything (coat hanger, pencil, etc) down inside of it.  If it gets damp, use a hair dryer on the cool setting to dry it.  If it gets soaked, call the office to schedule an appointment for a cast change. °  ° °After your dressing, cast or splint is removed; you may shower, but do not soak or scrub the wound.  Allow the water to run over it, and then gently pat it dry. ° °Swelling °It is normal for you to have swelling where you had surgery.  To reduce swelling and pain, keep your toes above your nose for at least 3 days after surgery.  It may be necessary to keep your foot or leg elevated for several weeks.  If it hurts, it should be elevated. ° °Follow Up °Call my office at [phone] when you are discharged from the hospital or surgery center to schedule an appointment to be seen two weeks after surgery. ° °Call my office at [phone] if you develop a fever >101.5° F, nausea, vomiting, bleeding from  the surgical site or severe pain.   ° ° °

## 2015-03-21 NOTE — Transfer of Care (Signed)
Immediate Anesthesia Transfer of Care Note  Patient: Grayce SessionsCynthia M Packett  Procedure(s) Performed: Procedure(s): LEFT ANKLE ARTHROSCOPY WITH DEBRIDEMENT,LEFT ANKLE LATERAL INCONTSTRUCTION (Left)  Patient Location: PACU  Anesthesia Type:GA combined with regional for post-op pain  Level of Consciousness: awake, alert , oriented and patient cooperative  Airway & Oxygen Therapy: Patient Spontanous Breathing and Patient connected to nasal cannula oxygen  Post-op Assessment: Report given to RN, Post -op Vital signs reviewed and stable and Patient moving all extremities  Post vital signs: Reviewed and stable  Last Vitals:  Filed Vitals:   03/21/15 0615  BP: 104/81  Pulse: 90  Temp: 36.8 C  Resp: 20    Complications: No apparent anesthesia complications

## 2015-03-21 NOTE — Brief Op Note (Signed)
03/21/2015  9:10 AM  PATIENT:  Grayce SessionsCynthia M Lanni  34 y.o. female  PRE-OPERATIVE DIAGNOSIS: 1.  Left ankle chronic instability      2.  Left ankle synovitis  POST-OPERATIVE DIAGNOSIS:  same  Procedure(s): 1.  LEFT ANKLE ARTHROSCOPY WITH limited DEBRIDEMENT 2.  LEFT ANKLE LATERAL ligament reconstruction  SURGEON:  Toni ArthursJohn Jacarius Handel, MD  ASSISTANT: Alfredo MartinezJustin Ollis, PA-C  ANESTHESIA:   General, regional  EBL:  minimal   TOURNIQUET:   Total Tourniquet Time Documented: Thigh (Left) - 51 minutes Total: Thigh (Left) - 51 minutes  COMPLICATIONS:  None apparent  DISPOSITION:  Extubated, awake and stable to recovery.  DICTATION ID:  [id number]

## 2015-03-21 NOTE — Progress Notes (Signed)
Report given to robin Siemon rn as caregiver 

## 2015-03-21 NOTE — Addendum Note (Signed)
Addendum  created 03/21/15 1653 by Marni GriffonKaren B Scherry Laverne, CRNA   Modules edited: Anesthesia Events, Narrator   Narrator:  Narrator: Event Log Edited

## 2015-03-21 NOTE — Progress Notes (Signed)
Orthopedic Tech Progress Note Patient Details:  Grayce SessionsCynthia M Stalling 29-Oct-1980 [id number]  Ortho Devices Type of Ortho Device: CAM walker Ortho Device/Splint Location: lle Ortho Device/Splint Interventions: Application   Lyden Redner 03/21/2015, 9:48 AM

## 2015-03-21 NOTE — H&P (Signed)
Barbara SessionsCynthia M [name] is an 34 y.o. female.   Chief Complaint: left ankle pain HPI: 34 y/o female with left ankle synovitis and chronic instability after an inversion injury in the past.  She has failed non op treatment and presents now for left ankle arthroscopy and lateral ligament reconstruction.  Past Medical History  Diagnosis Date  . Migraines   . GERD (gastroesophageal reflux disease)     occasional - Protonix as needed  . Interstitial cystitis   . Obese   . 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 Hypertension     started med. 2 weeks ago  . Pneumonia     hx  . Arthritis     Past Surgical History  Procedure Laterality Date  . Colonoscopy  06/26/2013  . Laparoscopic unilateral salpingo oopherectomy Right 07/31/2008  . Laparoscopic lysis of adhesions  07/31/2008; 10/01/2005  . Cystoscopy w/ dilation of bladder  10/04/2008  . Cystoscopy  07/31/2008  . Laparoscopic assisted vaginal hysterectomy  11/22/2007  . Hysteroscopy w/ endometrial ablation  10/01/2005  . Hysteroscopy with novasure  02/11/2007  . Dilation and curettage of uterus  10/01/2005; 02/11/2007  . Cesarean section  2000; 2003; 2006;2008    x 4  . Tubal ligation  10/01/2006    No family history on file. Social History:  reports that she has never smoked. She has never used smokeless tobacco. She reports that she drinks alcohol. She reports that she does not use illicit drugs.  Allergies:  Allergies  Allergen Reactions  . Onion Shortness Of Breath  . Rosemary Oil Anaphylaxis    Medications Prior to Admission  Medication Sig Dispense Refill  . ibuprofen (ADVIL,MOTRIN) 200 MG tablet Take 1,000 mg by mouth once. 1000 mg q 3 days    . methocarbamol (ROBAXIN) 500 MG tablet Take 500 mg by mouth every 8 (eight) hours as needed for muscle spasms.    . pantoprazole (PROTONIX) 40 MG tablet Take 40 mg by mouth daily.    . pentosan polysulfate (ELMIRON) 100 MG capsule Take 100 mg by mouth 3 (three) times daily as needed (bladder/pelvic  pain).     . triamterene-hydrochlorothiazide (DYAZIDE) 37.5-25 MG per capsule Take 1 capsule by mouth daily.      No results found for this or any previous visit (from the past 48 hour(s)). No results found.  ROS  No recent f/c/n/v/wt loss  Blood pressure 104/81, pulse 90, temperature 98.2 F (36.8 C), resp. rate 20, height 5\' 4"  (1.626 m), weight 137.531 kg (303 lb 3.2 oz), SpO2 98 %. Physical Exam obese female in nad.  A and O x 4.  Mood and affect normal.  EOMI.  resp unlabored.  L ankle without swelling.  Small effusion.  TTP at atfl and cfl.  No lymphadenopathy.  5/5 strength in PF, DF inversion and eversion.  Assessment/Plan L ankle instability and synovitis - to OR for left ankle arthroscopy and lateral ligament reconstruciton.  The risks and benefits of the alternative treatment options have been discussed in detail.  The patient wishes to proceed with surgery and specifically understands risks of bleeding, infection, nerve damage, blood clots, need for additional surgery, amputation and death.   Toni ArthursHEWITT, [name] 03/21/2015, 7:17 AM

## 2015-03-21 NOTE — Op Note (Signed)
Barbara [name]:  Lucks, [name]             ACCOUNT NO.:  [id number]  MEDICAL RECORD NO.:  [id number]  LOCATION:  [facility]                         FACILITY:  [facility]  PHYSICIAN:  Toni ArthursJohn Sable Knoles, MD        DATE OF BIRTH:  12/20/1980  DATE OF PROCEDURE:  03/21/2015 DATE OF DISCHARGE:  03/21/2015                              OPERATIVE REPORT   PREOPERATIVE DIAGNOSES: 1. Left ankle chronic instability. 2. Left ankle synovitis.  POSTOPERATIVE DIAGNOSES: 1. Left ankle chronic instability. 2. Left ankle synovitis.  PROCEDURE: 1. Left ankle arthroscopy with limited debridement of anterior gutter     synovitis. 2. Left ankle lateral ligament reconstruction.  SURGEON:  Toni ArthursJohn Megann Easterwood, MD  ASSISTANT:  Alfredo MartinezJustin Ollis, PA-C.  ANESTHESIA:  General, regional.  ESTIMATED BLOOD LOSS:  Minimal.  TOURNIQUET TIME:  51 minutes at 350 mmHg.  COMPLICATIONS:  None apparent.  DISPOSITION:  Extubated, awake, and stable to recovery.  INDICATIONS FOR PROCEDURE:  The patient is a 34 year old female with past medical history significant for morbid obesity.  She had left ankle injury in the past.  This was an inversion-type injury that has led to chronic ankle pain.  Her physical exam findings were consistent with lateral ligament instability.  She has signs and symptoms of ankle synovitis as well.  She presents now for ankle arthroscopy and lateral ligament reconstruction having failed nonoperative treatment today.  She understands the risks and benefits, the alternative treatment options and elects surgical treatment.  She specifically understands the risks of bleeding, infection, nerve damage, blood clots, need for additional surgery, continued pain, continued instability, amputation and death.  PROCEDURE IN DETAIL:  After preoperative consent was obtained and the correct operative site was identified, the patient was brought to the operating room and placed supine on the operating table.  General anesthesia was  induced.  Preoperative antibiotics were administered. Surgical time-out was taken.  Left lower extremity was prepped and draped in standard sterile fashion with tourniquet around the thigh. Extremity was exsanguinated and the tourniquet was inflated to 350 mmHg. An anteromedial arthroscopy portal was established under direct vision. The arthroscope was inserted into the ankle joint.  An anterolateral portal was then established under direct vision using the nick and spread technique.  The ankle joint was carefully inspected with probe. The anterior gutter was noted to have significant synovitis.  Lateral gutter was noted to have no significant synovitis and no evidence of any loose body.  The articular cartilage of the talus was normal as was that of the tibial plafond.  The syndesmosis was healthy and intact.  The posteromedial and posterolateral gutters appeared healthy with no evidence of synovitis or loose body.  The medial gutter also appeared healthy with no evidence of synovitis or loose body.  Probe was removed and replaced with a shaver.  The shaver was used to resect the articular cartilage at the anterior gutter.  Arthroscopic instruments were then removed.  The portals were closed with horizontal mattress sutures of 3- 0 nylon.  Attention was then turned to the lateral aspect of the ankle where a longitudinal incision was made over the distal fibula.  Sharp dissection was carried down through the skin and  subcutaneous tissue.  The extensor retinaculum was identified and was mobilized.  The origin of the ATFL and CFL ligaments was taken down from the anterior aspect of the fibula. Peroneal tendons were inspected with no evidence of synovitis or tear. The cortical bone was removed with a rongeur.  Two Biomet JuggerKnot anchors were inserted.  These were used to repair the ATFL and CFL back up to the origin of the fibula, tightening them appropriately.  The sutures were then  passed through the retinaculum, advancing it on to the fibula as well.  Sutures were then passed through the elevated periosteum of the distal fibula and this was brought down over top of the repair.  Sutures were tied.  The patient's anterior drawer was now noted to be grade 0 both in neutral and in plantar flexion.  The wound was irrigated copiously, and Monocryl and nylon were used to close the skin incision.  Sterile dressings were applied followed by well-padded short-leg splint.  Tourniquet was released at 51 minutes.  The patient was awakened from anesthesia and transported to the recovery room in stable condition.  FOLLOWUP PLAN:  The patient will be nonweightbearing on the left lower extremity.  She will take aspirin for DVT prophylaxis.  She will follow up with me in 2 weeks for suture removal.  Alfredo Martinez, PA-C was present and scrubbed for the duration of the case.  His assistance was essential in positioning of the patient, prepping and draping, gaining and maintaining exposure, performing the operation, closing and dressing of the wounds and applying the splint.     Toni Arthurs, MD     JH/MEDQ  D:  03/21/2015  T:  03/21/2015  Job:  [id number]

## 2015-03-21 NOTE — Anesthesia Postprocedure Evaluation (Signed)
Anesthesia Post Note  Patient: Grayce SessionsCynthia M Baird  Procedure(s) Performed: Procedure(s) (LRB): LEFT ANKLE ARTHROSCOPY WITH DEBRIDEMENT,LEFT ANKLE LATERAL INCONTSTRUCTION (Left)  Patient location during evaluation: PACU Anesthesia Type: General Level of consciousness: awake and alert Pain management: pain level controlled Vital Signs Assessment: post-procedure vital signs reviewed and stable Respiratory status: spontaneous breathing, nonlabored ventilation, respiratory function stable and patient connected to nasal cannula oxygen Cardiovascular status: blood pressure returned to baseline and stable Postop Assessment: no signs of nausea or vomiting Anesthetic complications: no    Last Vitals:  Filed Vitals:   03/21/15 1025 03/21/15 1030  BP: 122/73 114/70  Pulse: 81 88  Temp:    Resp: 14 16    Last Pain:  Filed Vitals:   03/21/15 1100  PainSc: Asleep                 Shriyan Arakawa DAVID

## 2015-03-21 NOTE — Anesthesia Procedure Notes (Addendum)
Anesthesia Regional Block:  Popliteal block  Pre-Anesthetic Checklist: ,, timeout performed, Correct Patient, Correct Site, Correct Laterality, Correct Procedure, Correct Position, site marked, Risks and benefits discussed,  Surgical consent,  Pre-op evaluation,  At surgeon's request and post-op pain management  Laterality: Left  Prep: chloraprep       Needles:  Injection technique: Single-shot  Needle Type: Echogenic Stimulator Needle     Needle Length: 9cm 9 cm Needle Gauge: 21 and 21 G    Additional Needles:  Procedures: ultrasound guided (picture in chart) and nerve stimulator Popliteal block  Nerve Stimulator or Paresthesia:  Response: plantar flexion of foot, 0.45 mA,   Additional Responses:   Narrative:  Start time: 03/21/2015 7:11 AM End time: 03/21/2015 7:21 AM Injection made incrementally with aspirations every 5 mL.  Performed by: Personally  Anesthesiologist: HODIERNE, ADAM  Additional Notes: Functioning IV was confirmed and monitors were applied.  A 90mm 21ga Arrow echogenic stimulator needle was used. Sterile prep and drape,hand hygiene and sterile gloves were used.  Negative aspiration and negative test dose prior to incremental administration of local anesthetic. The patient tolerated the procedure well.  Ultrasound guidance: relevent anatomy identified, needle position confirmed, local anesthetic spread visualized around nerve(s), vascular puncture avoided.  Image printed for medical record.    Procedure Name: LMA Insertion Date/Time: 03/21/2015 7:45 AM Performed by: Darcey NoraJAMES, Kamden Stanislaw B Pre-anesthesia Checklist: Patient identified, Emergency Drugs available, Suction available and Patient being monitored Patient Re-evaluated:Patient Re-evaluated prior to inductionOxygen Delivery Method: Circle system utilized Preoxygenation: Pre-oxygenation with 100% oxygen Intubation Type: IV induction Ventilation: Mask ventilation without difficulty LMA: LMA inserted LMA  Size: 4.0 Number of attempts: 1 Placement Confirmation: breath sounds checked- equal and bilateral and positive ETCO2 Tube secured with: Tape (taped across cheeks) Dental Injury: Teeth and Oropharynx as per pre-operative assessment

## 2015-03-22 ENCOUNTER — Encounter (HOSPITAL_COMMUNITY): Payer: Self-pay | Admitting: Orthopedic Surgery

## 2018-09-05 HISTORY — PX: CARPAL TUNNEL WITH CUBITAL TUNNEL: SHX5608

## 2018-12-08 ENCOUNTER — Other Ambulatory Visit: Payer: Self-pay

## 2018-12-08 ENCOUNTER — Encounter (HOSPITAL_BASED_OUTPATIENT_CLINIC_OR_DEPARTMENT_OTHER): Payer: Self-pay

## 2018-12-15 ENCOUNTER — Other Ambulatory Visit (HOSPITAL_COMMUNITY)
Admission: RE | Admit: 2018-12-15 | Discharge: 2018-12-15 | Disposition: A | Discharge status: Home / Self Care | Payer: BC Managed Care – PPO | Source: Ambulatory Visit | Attending: Orthopaedic Surgery | Admitting: Orthopaedic Surgery

## 2018-12-15 DIAGNOSIS — Z01812 Encounter for preprocedural laboratory examination: Secondary | ICD-10-CM | POA: Diagnosis not present

## 2018-12-15 DIAGNOSIS — Z20828 Contact with and (suspected) exposure to other viral communicable diseases: Secondary | ICD-10-CM | POA: Insufficient documentation

## 2018-12-16 LAB — NOVEL CORONAVIRUS, NAA (HOSP ORDER, SEND-OUT TO REF LAB; TAT 18-24 HRS): SARS-CoV-2, NAA: NOT DETECTED

## 2018-12-18 NOTE — Anesthesia Preprocedure Evaluation (Addendum)
Anesthesia Evaluation  Patient identified by MRN, date of birth, ID band Patient awake    Reviewed: Allergy & Precautions, NPO status , Patient's Chart, lab work & pertinent test results  History of Anesthesia Complications Negative for: history of anesthetic complications  Airway Mallampati: II  TM Distance: >3 FB Neck ROM: Full    Dental no notable dental hx.    Pulmonary neg pulmonary ROS,    Pulmonary exam normal        Cardiovascular negative cardio ROS Normal cardiovascular exam     Neuro/Psych  Headaches, Anxiety    GI/Hepatic negative GI ROS, Neg liver ROS,   Endo/Other  Morbid obesity  Renal/GU negative Renal ROS  negative genitourinary   Musculoskeletal negative musculoskeletal ROS (+)   Abdominal   Peds  Hematology negative hematology ROS (+)   Anesthesia Other Findings Day of surgery medications reviewed with patient.  Reproductive/Obstetrics negative OB ROS                           Anesthesia Physical Anesthesia Plan  ASA: III  Anesthesia Plan: Regional   Post-op Pain Management:    Induction:   PONV Risk Score and Plan: 2 and Treatment may vary due to age or medical condition, Ondansetron, Propofol infusion, Midazolam and Dexamethasone  Airway Management Planned: Natural Airway and Simple Face Mask  Additional Equipment: None  Intra-op Plan:   Post-operative Plan:   Informed Consent: I have reviewed the patients History and Physical, chart, labs and discussed the procedure including the risks, benefits and alternatives for the proposed anesthesia with the patient or authorized representative who has indicated his/her understanding and acceptance.     Dental advisory given  Plan Discussed with: CRNA  Anesthesia Plan Comments:        Anesthesia Quick Evaluation

## 2018-12-19 ENCOUNTER — Ambulatory Visit (HOSPITAL_BASED_OUTPATIENT_CLINIC_OR_DEPARTMENT_OTHER)
Admission: RE | Admit: 2018-12-19 | Discharge: 2018-12-19 | Disposition: A | Discharge status: Home / Self Care | Payer: BC Managed Care – PPO | Attending: Orthopaedic Surgery | Admitting: Orthopaedic Surgery

## 2018-12-19 ENCOUNTER — Encounter (HOSPITAL_BASED_OUTPATIENT_CLINIC_OR_DEPARTMENT_OTHER): Payer: Self-pay | Admitting: Certified Registered"

## 2018-12-19 ENCOUNTER — Encounter (HOSPITAL_BASED_OUTPATIENT_CLINIC_OR_DEPARTMENT_OTHER)
Admission: RE | Disposition: A | Discharge status: Home / Self Care | Payer: Self-pay | Source: Home / Self Care | Attending: Orthopaedic Surgery

## 2018-12-19 ENCOUNTER — Ambulatory Visit (HOSPITAL_BASED_OUTPATIENT_CLINIC_OR_DEPARTMENT_OTHER): Payer: BC Managed Care – PPO | Admitting: Anesthesiology

## 2018-12-19 ENCOUNTER — Other Ambulatory Visit: Payer: Self-pay

## 2018-12-19 DIAGNOSIS — Z91018 Allergy to other foods: Secondary | ICD-10-CM | POA: Diagnosis not present

## 2018-12-19 DIAGNOSIS — Z6841 Body Mass Index (BMI) 40.0 and over, adult: Secondary | ICD-10-CM | POA: Diagnosis not present

## 2018-12-19 DIAGNOSIS — F419 Anxiety disorder, unspecified: Secondary | ICD-10-CM | POA: Diagnosis not present

## 2018-12-19 DIAGNOSIS — G5622 Lesion of ulnar nerve, left upper limb: Secondary | ICD-10-CM | POA: Insufficient documentation

## 2018-12-19 DIAGNOSIS — N3010 Interstitial cystitis (chronic) without hematuria: Secondary | ICD-10-CM | POA: Insufficient documentation

## 2018-12-19 DIAGNOSIS — G5602 Carpal tunnel syndrome, left upper limb: Secondary | ICD-10-CM | POA: Diagnosis not present

## 2018-12-19 DIAGNOSIS — Z79899 Other long term (current) drug therapy: Secondary | ICD-10-CM | POA: Insufficient documentation

## 2018-12-19 DIAGNOSIS — G43909 Migraine, unspecified, not intractable, without status migrainosus: Secondary | ICD-10-CM | POA: Diagnosis not present

## 2018-12-19 DIAGNOSIS — Z90710 Acquired absence of both cervix and uterus: Secondary | ICD-10-CM | POA: Diagnosis not present

## 2018-12-19 DIAGNOSIS — M1990 Unspecified osteoarthritis, unspecified site: Secondary | ICD-10-CM | POA: Diagnosis not present

## 2018-12-19 DIAGNOSIS — Z9884 Bariatric surgery status: Secondary | ICD-10-CM | POA: Insufficient documentation

## 2018-12-19 HISTORY — PX: ULNAR TUNNEL RELEASE: SHX820

## 2018-12-19 HISTORY — DX: Anxiety disorder, unspecified: F41.9

## 2018-12-19 HISTORY — PX: CARPAL TUNNEL RELEASE: SHX101

## 2018-12-19 SURGERY — CARPAL TUNNEL RELEASE
Anesthesia: Regional | Site: Wrist | Laterality: Left

## 2018-12-19 MED ORDER — FENTANYL CITRATE (PF) 100 MCG/2ML IJ SOLN: 25.0000 ug | INTRAMUSCULAR | Status: DC | PRN

## 2018-12-19 MED ORDER — BUPIVACAINE HCL (PF) 0.25 % IJ SOLN
INTRAMUSCULAR | Status: AC
  Filled 2018-12-19: qty 30

## 2018-12-19 MED ORDER — CEFAZOLIN SODIUM-DEXTROSE 2-4 GM/100ML-% IV SOLN
2.0000 g | INTRAVENOUS | Status: AC
  Administered 2018-12-19: 2 g via INTRAVENOUS

## 2018-12-19 MED ORDER — FENTANYL CITRATE (PF) 100 MCG/2ML IJ SOLN
50.0000 ug | INTRAMUSCULAR | Status: DC | PRN
  Administered 2018-12-19: 50 ug via INTRAVENOUS

## 2018-12-19 MED ORDER — OXYCODONE HCL 5 MG/5ML PO SOLN: 5.0000 mg | Freq: Once | ORAL | Status: DC | PRN

## 2018-12-19 MED ORDER — LIDOCAINE HCL (CARDIAC) PF 100 MG/5ML IV SOSY
PREFILLED_SYRINGE | INTRAVENOUS | Status: DC | PRN
  Administered 2018-12-19: 30 mg via INTRAVENOUS

## 2018-12-19 MED ORDER — HYDROCODONE-ACETAMINOPHEN 5-325 MG PO TABS: 1.0000 | ORAL_TABLET | Freq: Four times a day (QID) | ORAL | 0 refills | Status: AC | PRN

## 2018-12-19 MED ORDER — LACTATED RINGERS IV SOLN
INTRAVENOUS | Status: DC
  Administered 2018-12-19 (×2): via INTRAVENOUS

## 2018-12-19 MED ORDER — CHLORHEXIDINE GLUCONATE 4 % EX LIQD: 60.0000 mL | Freq: Once | CUTANEOUS | Status: DC

## 2018-12-19 MED ORDER — OXYCODONE HCL 5 MG PO TABS: 5.0000 mg | ORAL_TABLET | Freq: Once | ORAL | Status: DC | PRN

## 2018-12-19 MED ORDER — ACETAMINOPHEN 500 MG PO TABS
ORAL_TABLET | ORAL | Status: AC
  Filled 2018-12-19: qty 2

## 2018-12-19 MED ORDER — ONDANSETRON HCL 4 MG/2ML IJ SOLN
INTRAMUSCULAR | Status: DC | PRN
  Administered 2018-12-19: 4 mg via INTRAVENOUS

## 2018-12-19 MED ORDER — BUPIVACAINE HCL (PF) 0.5 % IJ SOLN
INTRAMUSCULAR | Status: AC
  Filled 2018-12-19: qty 30

## 2018-12-19 MED ORDER — MIDAZOLAM HCL 2 MG/2ML IJ SOLN
1.0000 mg | INTRAMUSCULAR | Status: DC | PRN
  Administered 2018-12-19: 1 mg via INTRAVENOUS

## 2018-12-19 MED ORDER — MIDAZOLAM HCL 2 MG/2ML IJ SOLN
INTRAMUSCULAR | Status: AC
  Filled 2018-12-19: qty 2

## 2018-12-19 MED ORDER — PROPOFOL 500 MG/50ML IV EMUL
INTRAVENOUS | Status: DC | PRN
  Administered 2018-12-19: 100 ug/kg/min via INTRAVENOUS

## 2018-12-19 MED ORDER — SCOPOLAMINE 1 MG/3DAYS TD PT72: 1.0000 | MEDICATED_PATCH | Freq: Once | TRANSDERMAL | Status: DC

## 2018-12-19 MED ORDER — PROMETHAZINE HCL 25 MG/ML IJ SOLN: 6.2500 mg | INTRAMUSCULAR | Status: DC | PRN

## 2018-12-19 MED ORDER — LIDOCAINE HCL (PF) 1 % IJ SOLN
INTRAMUSCULAR | Status: AC
  Filled 2018-12-19: qty 30

## 2018-12-19 MED ORDER — BUPIVACAINE-EPINEPHRINE (PF) 0.5% -1:200000 IJ SOLN
INTRAMUSCULAR | Status: DC | PRN
  Administered 2018-12-19: 30 mL via PERINEURAL

## 2018-12-19 MED ORDER — ACETAMINOPHEN 500 MG PO TABS
1000.0000 mg | ORAL_TABLET | Freq: Once | ORAL | Status: AC
  Administered 2018-12-19: 1000 mg via ORAL

## 2018-12-19 MED ORDER — FENTANYL CITRATE (PF) 100 MCG/2ML IJ SOLN
INTRAMUSCULAR | Status: AC
  Filled 2018-12-19: qty 2

## 2018-12-19 MED ORDER — LIDOCAINE HCL (PF) 1 % IJ SOLN
INTRAMUSCULAR | Status: DC | PRN
  Administered 2018-12-19: 10 mL

## 2018-12-19 MED ORDER — CEFAZOLIN SODIUM-DEXTROSE 2-4 GM/100ML-% IV SOLN
INTRAVENOUS | Status: AC
  Filled 2018-12-19: qty 100

## 2018-12-19 SURGICAL SUPPLY — 53 items
APL PRP STRL LF DISP 70% ISPRP (MISCELLANEOUS) ×2
BLADE CARPAL TUNNEL SNGL USE (BLADE) ×2 IMPLANT
BLADE SURG 15 STRL LF DISP TIS (BLADE) ×4 IMPLANT
BLADE SURG 15 STRL SS (BLADE) ×6
BNDG CMPR 9X4 STRL LF SNTH (GAUZE/BANDAGES/DRESSINGS) ×2
BNDG COHESIVE 3X5 TAN STRL LF (GAUZE/BANDAGES/DRESSINGS) ×3 IMPLANT
BNDG ESMARK 4X9 LF (GAUZE/BANDAGES/DRESSINGS) ×3 IMPLANT
BNDG GAUZE ELAST 4 BULKY (GAUZE/BANDAGES/DRESSINGS) ×3 IMPLANT
BRUSH SCRUB EZ PLAIN DRY (MISCELLANEOUS) IMPLANT
CHLORAPREP W/TINT 26 (MISCELLANEOUS) ×3 IMPLANT
CORD BIPOLAR FORCEPS 12FT (ELECTRODE) ×3 IMPLANT
COVER BACK TABLE REUSABLE LG (DRAPES) ×3 IMPLANT
COVER WAND RF STERILE (DRAPES) IMPLANT
CUFF TOURN SGL QUICK 18X4 (TOURNIQUET CUFF) IMPLANT
DRAIN PENROSE 1/4X12 LTX STRL (WOUND CARE) IMPLANT
DRAPE EXTREMITY T 121X128X90 (DISPOSABLE) ×3 IMPLANT
DRAPE HALF SHEET 70X43 (DRAPES) ×3 IMPLANT
DRAPE SURG 17X23 STRL (DRAPES) ×3 IMPLANT
DRSG EMULSION OIL 3X3 NADH (GAUZE/BANDAGES/DRESSINGS) ×3 IMPLANT
GAUZE 4X4 16PLY RFD (DISPOSABLE) IMPLANT
GAUZE SPONGE 4X4 12PLY STRL (GAUZE/BANDAGES/DRESSINGS) IMPLANT
GAUZE XEROFORM 1X8 LF (GAUZE/BANDAGES/DRESSINGS) ×3 IMPLANT
GLOVE BIO SURGEON STRL SZ 6.5 (GLOVE) ×1 IMPLANT
GLOVE BIOGEL PI IND STRL 7.0 (GLOVE) IMPLANT
GLOVE BIOGEL PI IND STRL 8 (GLOVE) ×2 IMPLANT
GLOVE BIOGEL PI INDICATOR 7.0 (GLOVE) ×1
GLOVE BIOGEL PI INDICATOR 8 (GLOVE) ×2
GLOVE SURG SYN 7.5  E (GLOVE) ×1
GLOVE SURG SYN 7.5 E (GLOVE) ×2 IMPLANT
GLOVE SURG SYN 7.5 PF PI (GLOVE) ×2 IMPLANT
GOWN STRL REUS W/ TWL LRG LVL3 (GOWN DISPOSABLE) ×4 IMPLANT
GOWN STRL REUS W/TWL LRG LVL3 (GOWN DISPOSABLE) ×6
LOOP VESSEL MAXI BLUE (MISCELLANEOUS) IMPLANT
NDL HYPO 25X1 1.5 SAFETY (NEEDLE) ×4 IMPLANT
NDL SAFETY ECLIPSE 18X1.5 (NEEDLE) ×2 IMPLANT
NEEDLE HYPO 18GX1.5 SHARP (NEEDLE) ×3
NEEDLE HYPO 22GX1.5 SAFETY (NEEDLE) IMPLANT
NEEDLE HYPO 25X1 1.5 SAFETY (NEEDLE) ×6 IMPLANT
NS IRRIG 1000ML POUR BTL (IV SOLUTION) ×3 IMPLANT
PACK BASIN DAY SURGERY FS (CUSTOM PROCEDURE TRAY) ×3 IMPLANT
PAD ALCOHOL SWAB (MISCELLANEOUS) ×24 IMPLANT
PAD CAST 3X4 CTTN HI CHSV (CAST SUPPLIES) ×4 IMPLANT
PADDING CAST ABS 4INX4YD NS (CAST SUPPLIES) ×1
PADDING CAST ABS COTTON 4X4 ST (CAST SUPPLIES) ×2 IMPLANT
PADDING CAST COTTON 3X4 STRL (CAST SUPPLIES) ×6
SUT FIBERWIRE 4-0 18 TAPR NDL (SUTURE)
SUT PROLENE 4 0 PS 2 18 (SUTURE) ×3 IMPLANT
SUTURE FIBERWR 4-0 18 TAPR NDL (SUTURE) IMPLANT
SYR BULB 3OZ (MISCELLANEOUS) ×3 IMPLANT
SYR CONTROL 10ML LL (SYRINGE) ×6 IMPLANT
TOWEL GREEN STERILE FF (TOWEL DISPOSABLE) ×3 IMPLANT
TRAY DSU PREP LF (CUSTOM PROCEDURE TRAY) ×3 IMPLANT
UNDERPAD 30X36 HEAVY ABSORB (UNDERPADS AND DIAPERS) ×3 IMPLANT

## 2018-12-19 NOTE — Anesthesia Postprocedure Evaluation (Signed)
Anesthesia Post Note  Patient: Barbara [name]  Procedure(s) Performed: Left carpal tunnel release, Left cubital tunnel release (Left Arm Lower) CUBITAL TUNNEL RELEASE (Left Wrist)     Patient location during evaluation: PACU Anesthesia Type: Regional Level of consciousness: awake and alert and oriented Pain management: pain level controlled Vital Signs Assessment: post-procedure vital signs reviewed and stable Respiratory status: spontaneous breathing, nonlabored ventilation and respiratory function stable Cardiovascular status: blood pressure returned to baseline Postop Assessment: no apparent nausea or vomiting Anesthetic complications: no    Last Vitals:  Vitals:   12/19/18 0845 12/19/18 0900  BP: 128/72 124/70  Pulse: 75 71  Resp: 16 18  Temp:    SpO2: 93% 94%    Last Pain:  Vitals:   12/19/18 0900  TempSrc:   PainSc: 0-No pain                 Brennan Bailey

## 2018-12-19 NOTE — Anesthesia Procedure Notes (Signed)
Anesthesia Regional Block: Supraclavicular block   Pre-Anesthetic Checklist: ,, timeout performed, Correct Patient, Correct Site, Correct Laterality, Correct Procedure, Correct Position, site marked, Risks and benefits discussed, pre-op evaluation,  At surgeon's request and post-op pain management  Laterality: Left  Prep: Maximum Sterile Barrier Precautions used, chloraprep       Needles:  Injection technique: Single-shot  Needle Type: Echogenic Stimulator Needle     Needle Length: 9cm  Needle Gauge: 22     Additional Needles:   Procedures:,,,, ultrasound used (permanent image in chart),,,,  Narrative:  Start time: 12/19/2018 7:19 AM End time: 12/19/2018 7:22 AM Injection made incrementally with aspirations every 5 mL.  Performed by: Personally  Anesthesiologist: Brennan Bailey, MD  Additional Notes: Risks, benefits, and alternative discussed. Patient gave consent for procedure. Patient prepped and draped in sterile fashion. Sedation administered, patient remains easily responsive to voice. Relevant anatomy identified with ultrasound guidance. Local anesthetic given in 5cc increments with no signs or symptoms of intravascular injection. No pain or paraesthesias with injection. Patient monitored throughout procedure with signs of LAST or immediate complications. Tolerated well. Ultrasound image placed in chart.  Tawny Asal, MD

## 2018-12-19 NOTE — Anesthesia Procedure Notes (Signed)
Procedure Name: MAC Date/Time: 12/19/2018 7:41 AM Performed by: Signe Colt, CRNA Pre-anesthesia Checklist: Patient identified, Emergency Drugs available, Suction available, Patient being monitored and Timeout performed Patient Re-evaluated:Patient Re-evaluated prior to induction Oxygen Delivery Method: Simple face mask

## 2018-12-19 NOTE — Progress Notes (Signed)
Assisted Dr. Howze with left, ultrasound guided, supraclavicular block. Side rails up, monitors on throughout procedure. See vital signs in flow sheet. Tolerated Procedure well. 

## 2018-12-19 NOTE — Discharge Instructions (Signed)
Discharge Instructions  -Keep the dressings in place for the next 4 to 5 days.  While the surgical dressings are on they must stay clean and dry -Once you take the surgical dressings off you can cover the incisions with Band-Aids.  It is okay to get the incisions wet at that point in gentle soap and water - Take all medication as prescribed. Transition to over the counter pain medication as your pain improves - Keep the hand elevated over the next 48-72 hours to help with pain and swelling - Move all digits not restricted by the dressings regularly to prevent stiffness - Please call to schedule a follow up appointment with Dr. Roney Mansreighton and therapy at [phone] for 10-14 days following surgery - Your pain medication have been send digitally to your pharmacy    Able to take Tylenol at 1:15 PM on 12/19/2018 if needed.    Post Anesthesia Home Care Instructions  Activity: Get plenty of rest for the remainder of the day. A responsible individual must stay with you for 24 hours following the procedure.  For the next 24 hours, DO NOT: -Drive a car -Advertising copywriterperate machinery -Drink alcoholic beverages -Take any medication unless instructed by your physician -Make any legal decisions or sign important papers.  Meals: Start with liquid foods such as gelatin or soup. Progress to regular foods as tolerated. Avoid greasy, spicy, heavy foods. If nausea and/or vomiting occur, drink only clear liquids until the nausea and/or vomiting subsides. Call your physician if vomiting continues.  Special Instructions/Symptoms: Your throat may feel dry or sore from the anesthesia or the breathing tube placed in your throat during surgery. If this causes discomfort, gargle with warm salt water. The discomfort should disappear within 24 hours.  If you had a scopolamine patch placed behind your ear for the management of post- operative nausea and/or vomiting:  1. The medication in the patch is effective for 72  hours, after which it should be removed.  Wrap patch in a tissue and discard in the trash. Wash hands thoroughly with soap and water. 2. You may remove the patch earlier than 72 hours if you experience unpleasant side effects which may include dry mouth, dizziness or visual disturbances. 3. Avoid touching the patch. Wash your hands with soap and water after contact with the patch.       Regional Anesthesia Blocks  1. Numbness or the inability to move the "blocked" extremity may last from 3-48 hours after placement. The length of time depends on the medication injected and your individual response to the medication. If the numbness is not going away after 48 hours, call your surgeon.  2. The extremity that is blocked will need to be protected until the numbness is gone and the  Strength has returned. Because you cannot feel it, you will need to take extra care to avoid injury. Because it may be weak, you may have difficulty moving it or using it. You may not know what position it is in without looking at it while the block is in effect.  3. For blocks in the legs and feet, returning to weight bearing and walking needs to be done carefully. You will need to wait until the numbness is entirely gone and the strength has returned. You should be able to move your leg and foot normally before you try and bear weight or walk. You will need someone to be with you when you first try to ensure you do not fall and possibly  risk injury.  4. Bruising and tenderness at the needle site are common side effects and will resolve in a few days.  5. Persistent numbness or new problems with movement should be communicated to the surgeon or the [place] [phone] [place] [phone]).

## 2018-12-19 NOTE — Op Note (Signed)
PREOPERATIVE DIAGNOSIS: Left carpal and cubital tunnel syndrome  POSTOPERATIVE DIAGNOSIS: Same  ATTENDING PHYSICIAN: Maudry Mayhew. Jeannie Fend, III, MD who was present and scrubbed for the entire case   ASSISTANT SURGEON: None.   ANESTHESIA: Regional with MAC  SURGICAL PROCEDURES: 1.  Left carpal tunnel release 2.  Left cubital tunnel release, in situ  SURGICAL INDICATIONS: Patient is a 38 year old female who was seen and evaluated by me in clinic.  She was found to have numbness and tingling throughout her hand.  Her exam was consistent with both carpal and cubital tunnel syndrome.  She had a similar exam on the contralateral side which responded very well from both carpal and cubital tunnel release.  After discussing treatment options the patient did wish to proceed with left-sided carpal and cubital tunnel release and presents today for that.  FINDING: There is thickening of the transverse carpal ligament with compression of the median nerve.  Following carpal tunnel release the median nerve and tendinous structures were all found to be fully intact.  The ulnar nerve was compressed at the medial elbow by thickened Osborne's ligament.  There was an hourglass deformity.  Following cubital tunnel release the nerve was fully intact and found to be stable posterior to the medial epicondyle.  DESCRIPTION OF PROCEDURE: The patient was identified in preoperative holding area where the risk benefits and alternatives of the procedure were discussed with the patient.  These include but are not limited to infection, bleeding, damage to surrounding structures including blood vessels and nerves, pain, stiffness, recurrence and need for additional procedures.  Informed consent was obtained at that time patient's left arm was marked with surgeon marking pen.  She then underwent a left upper extremity plexus block by anesthesia and then was brought to the.  A timeout was performed identifying the correct patient  operative site.  She was positioned supine on the operative table with her hand outstretched on a hand table.  A tourniquet was placed on the upper arm and the arm was then prepped and draped in usual sterile fashion.  She was induced under MAC sedation.  The limb was exsanguinated and the tourniquet was inflated.  Began with the carpal tunnel release.  A longitudinal incision was made over the palm in line with the fourth ray at the distal aspect of the transverse carpal ligament.  Sharp dissection was carried down through the subcutaneous tissues.  The pulmonary upon neurosis was identified and split longitudinally.  The transverse fibers of the transverse carpal ligament were then seen and care was taken to release the ligament working distally to proximally.  Full distal release the ligament was ensured by visualization of the palmar fat.  Once the ligament was released using a 15 blade up to the proximal edge of the skin incision a hemostat was passed volar to the transverse carpal ligament.  This dilated bluntly along the volar ligament surface.  The Integra carpal tunnel release ski was then inserted deep to the carpal tunnel taking care to ensure that it was pressed along the ligament throughout its course.  The blade was then advanced through the trough carefully releasing the remaining proximal portion of the transverse carpal ligament.  Following release a Valora Corporal was used to visualize the carpal tunnel structures which were all found to be fully intact.  The radial and ulnar leaflets of the ligament were then carefully elevated off the tenosynovium as well.  The wound was irrigated with with normal saline and the skin was closed  with interrupted 4-0 Prolene sutures.  Attention was then turned to the medial elbow.  An L-shaped incision was made centered about the medial epicondyle.  Blunt dissection was carried out through the subcutaneous tissues.  Care was taken to protect crossing sensory nerves.   Once down to the medial epicondyle the ulnar nerve was found proximal to the Osborne ligament.  Blunt dissection was performed superficial to the nerve followed by release of the nerve from its overlying fascial constraints back to the level of the arcade of Struthers.  Care was taken to protect the nerve throughout this.  Once full proximal release of the nerve had been achieved change directions and worked distally tracing the nerve posterior to the medial epicondyle.  There was a very thickened Osborne's ligament which was released.  Following release there is visualized hourglass deformity of the ulnar nerve posterior to the medial epicondyle.  Continue.distal dissection of the nerve was performed tracing into the 2 heads of the FCU.  Both superficial and deep fascial layers were released.  This was done up to the level of the first motor branch to the FCU.  Following full release of the nerve both proximally and distally, the elbow was flexed and extended.  The nerve was found to be stable posterior to the medial epicondyle.  Thus an in situ release of the nerve was performed.  The wound was then copiously irrigated with normal saline.  The skin was closed with deep 3-0 Vicryl sutures followed by interrupted 4-0 Prolene sutures.  Xeroform 4 x 4's and soft dressing were applied.  This applies to the carpal tunnel incision as well.  The tourniquet was released and the patient had return of brisk capillary refill to all the digits.  She was awoken from her sedation and taken to the PACU in stable condition.  She tolerated the procedure well and there were no complications.  ESTIMATED BLOOD LOSS: 10 mL's  TOURNIQUET TIME: 38 minutes  SPECIMENS: None  POSTOPERATIVE PLAN: The patient will be discharged home and seen back  in the office in approximately 10-12 days for wound check, suture  removal, and then be sent to a therapist for elbow, wrist and digit range of motion, edema control and modalities as  indicated.  IMPLANTS: None

## 2018-12-19 NOTE — H&P (Signed)
ORTHOPAEDIC H&P  PCP:  Blair HeysEhinger, Robert, MD  Chief Complaint: Left hand numbness  HPI: Barbara [name] is a 38 y.o. female who complains of left hand numbness.  She was seen and evaluated in clinic and diagnosed with left carpal and cubital tunnel syndrome.  She had similar symptoms on the right side which underwent a right carpal and cubital tunnel release which she is done very well from.  After further discussing treatment options she did wish to proceed with left carpal and cubital tunnel release and presents today for that.  Past Medical History:  Diagnosis Date  . Anxiety   . Arthritis   . Interstitial cystitis   . 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 Migraines   . Obese   . Pneumonia    hx   Past Surgical History:  Procedure Laterality Date  . ANKLE ARTHROSCOPY Left 03/21/2015   Procedure: LEFT ANKLE ARTHROSCOPY WITH DEBRIDEMENT,LEFT ANKLE LATERAL INCONTSTRUCTION;  Surgeon: Toni ArthursJohn Hewitt, MD;  Location: [facility] OR;  Service: Orthopedics;  Laterality: Left;  . CARPAL TUNNEL WITH CUBITAL TUNNEL Right 09/2018   Dr Roney Mansreighton  . CESAREAN SECTION  2000; 2003; 2006;2008   x 4  . COLONOSCOPY  06/26/2013  . CYSTOSCOPY  07/31/2008  . CYSTOSCOPY W/ DILATION OF BLADDER  10/04/2008  . DILATION AND CURETTAGE OF UTERUS  10/01/2005; 02/11/2007  . GASTRIC BYPASS    . HYSTEROSCOPY W/ ENDOMETRIAL ABLATION  10/01/2005  . HYSTEROSCOPY WITH NOVASURE  02/11/2007  . LAPAROSCOPIC ASSISTED VAGINAL HYSTERECTOMY  11/22/2007  . LAPAROSCOPIC LYSIS OF ADHESIONS  07/31/2008; 10/01/2005  . LAPAROSCOPIC UNILATERAL SALPINGO OOPHERECTOMY Right 07/31/2008  . TUBAL LIGATION  10/01/2006   Social History   Socioeconomic History  . Marital status: Divorced    Spouse name: Not on file  . Number of children: Not on file  . Years of education: Not on file  . Highest education level: Not on file  Occupational History  . Not on file  Social Needs  . Financial resource strain: Not on file  . Food insecurity    Worry:  Not on file    Inability: Not on file  . Transportation needs    Medical: Not on file    Non-medical: Not on file  Tobacco Use  . Smoking status: Never Smoker  . Smokeless tobacco: Never Used  Substance and Sexual Activity  . Alcohol use: Not Currently  . Drug use: No  . Sexual activity: Not on file  Lifestyle  . Physical activity    Days per week: Not on file    Minutes per session: Not on file  . Stress: Not on file  Relationships  . Social Musicianconnections    Talks on phone: Not on file    Gets together: Not on file    Attends religious service: Not on file    Active member of club or organization: Not on file    Attends meetings of clubs or organizations: Not on file    Relationship status: Not on file  Other Topics Concern  . Not on file  Social History Narrative  . Not on file   History reviewed. No pertinent family history. Allergies  Allergen Reactions  . Celery Oil Anaphylaxis  . Onion Shortness Of Breath  . Rosemary Oil Anaphylaxis   Prior to Admission medications   Medication Sig Start Date End Date Taking? Authorizing Provider  ALPRAZolam Prudy Feeler(XANAX) 0.25 MG tablet Take 0.25 mg by mouth at bedtime as needed for anxiety.  Yes [provider]  cholecalciferol (VITAMIN D3) 25 MCG (1000 UT) tablet Take 1,000 Units by mouth daily.   Yes [provider]  citalopram (CELEXA) 20 MG tablet Take 20 mg by mouth daily.   Yes [provider]  Multiple Vitamin (MULTIVITAMIN) tablet Take 1 tablet by mouth daily.   Yes [provider]  methocarbamol (ROBAXIN) 750 MG tablet Take 750 mg by mouth daily.    [provider]   No results found.  Positive ROS: All other systems have been reviewed and were otherwise negative with the exception of those mentioned in the HPI and as above.  Physical Exam: General: Alert, no acute distress Cardiovascular: No pedal edema Respiratory: No cyanosis, no use of accessory musculature Skin: No lesions  in the area of chief complaint Psychiatric: Patient is competent for consent with normal mood and affect Lymphatic: No axillary or cervical lymphadenopathy  MUSCULOSKELETAL: Examination of left upper extremity shows grossly normal-appearing left arm.  There is no significant swelling, erythema, lymphadenopathy or signs of infection.  Patient has Tinel's at the medial elbow and at the wrist.  She has a positive elbow flexion test as well as a positive Phalen's.  Grossly her sensation is intact light touch throughout all digits.  Her fingertips are warm well perfused with brisk capillary refill.   Assessment: Left carpal and cubital tunnel syndrome  Plan: OR for left carpal and cubital tunnel release Risks of surgery were once again discussed with her and consent was obtained. Plan for discharge home postoperatively.    Verner Mould, MD [phone]   12/19/2018 7:20 AM

## 2018-12-19 NOTE — Transfer of Care (Signed)
Immediate Anesthesia Transfer of Care Note  Patient: Barbara [name]  Procedure(s) Performed: Left carpal tunnel release, Left cubital tunnel release (Left Arm Lower) CUBITAL TUNNEL RELEASE (Left Wrist)  Patient Location: PACU  Anesthesia Type:MAC combined with regional for post-op pain  Level of Consciousness: awake, alert , oriented and patient cooperative  Airway & Oxygen Therapy: Patient Spontanous Breathing  Post-op Assessment: Report given to RN and Post -op Vital signs reviewed and stable  Post vital signs: Reviewed and stable  Last Vitals:  Vitals Value Taken Time  BP    Temp    Pulse    Resp    SpO2      Last Pain:  Vitals:   12/19/18 0724  TempSrc:   PainSc: 0-No pain      Patients Stated Pain Goal: 3 (44/46/19 0122)  Complications: No apparent anesthesia complications

## 2018-12-20 ENCOUNTER — Encounter (HOSPITAL_BASED_OUTPATIENT_CLINIC_OR_DEPARTMENT_OTHER): Payer: Self-pay | Admitting: Orthopaedic Surgery

## 2021-10-24 ENCOUNTER — Telehealth: Payer: Self-pay

## 2021-10-24 ENCOUNTER — Institutional Professional Consult (permissible substitution): Payer: BC Managed Care – PPO | Admitting: Cardiovascular Disease

## 2021-10-24 NOTE — Telephone Encounter (Signed)
Reached out to patient to inform her that we can see the transcribed TEE report, and Dr Excell Seltzer can give his recommendation based on this, but he would truly need the images of the TEE to conclusively determine need for closure. Asked that patient reach out to cardiology office to request a copy of the test on a disc. She states she has an appt there Tuesday for a stress test (earlier in the day) and will attempt to pick it up and bring to her appt here at 4:40.

## 2021-10-28 ENCOUNTER — Institutional Professional Consult (permissible substitution): Payer: BC Managed Care – PPO | Admitting: Cardiovascular Disease

## 2023-10-28 ENCOUNTER — Other Ambulatory Visit: Payer: Self-pay | Admitting: Medical Genetics

## 2023-11-02 ENCOUNTER — Other Ambulatory Visit

## 2023-11-02 DIAGNOSIS — Z006 Encounter for examination for normal comparison and control in clinical research program: Secondary | ICD-10-CM

## 2023-11-13 LAB — GENECONNECT MOLECULAR SCREEN: Genetic Analysis Overall Interpretation: NEGATIVE
